# Patient Record
Sex: Male | Born: 1965 | Race: Black or African American | Hispanic: No | Marital: Married | Smoking: Current every day smoker
Health system: Southern US, Community
[De-identification: ages and names within clinical notes are randomized; demographics above are authoritative.]

---

## 1998-10-09 ENCOUNTER — Emergency Department (HOSPITAL_COMMUNITY): Admission: EM | Admit: 1998-10-09 | Discharge: 1998-10-09 | Payer: Self-pay | Admitting: Emergency Medicine

## 2013-08-24 ENCOUNTER — Emergency Department (HOSPITAL_COMMUNITY)
Admission: EM | Admit: 2013-08-24 | Discharge: 2013-08-24 | Disposition: A | Discharge status: Home / Self Care | Payer: Managed Care, Other (non HMO) | Source: Home / Self Care | Attending: Family Medicine | Admitting: Family Medicine

## 2013-08-24 ENCOUNTER — Encounter (HOSPITAL_COMMUNITY): Payer: Self-pay | Admitting: Emergency Medicine

## 2013-08-24 DIAGNOSIS — M549 Dorsalgia, unspecified: Secondary | ICD-10-CM

## 2013-08-24 MED ORDER — KETOROLAC TROMETHAMINE 60 MG/2ML IM SOLN
60.0000 mg | Freq: Once | INTRAMUSCULAR | Status: AC
  Administered 2013-08-24: 60 mg via INTRAMUSCULAR

## 2013-08-24 MED ORDER — KETOROLAC TROMETHAMINE 60 MG/2ML IM SOLN
INTRAMUSCULAR | Status: AC
  Filled 2013-08-24: qty 2

## 2013-08-24 MED ORDER — PREDNISONE 10 MG PO KIT: PACK | ORAL | Status: DC

## 2013-08-24 MED ORDER — ETODOLAC 500 MG PO TABS: 500.0000 mg | ORAL_TABLET | Freq: Two times a day (BID) | ORAL | Status: DC | PRN

## 2013-08-24 NOTE — ED Provider Notes (Signed)
CSN: [id number]     Arrival date & time 08/24/13  1116 History   First MD Initiated Contact with Patient 08/24/13 1318     Chief Complaint  Patient presents with  . Back Pain   (Consider location/radiation/quality/duration/timing/severity/associated sxs/prior Treatment) HPI Comments: 47 year old male presents complaining of lower back pain. This is been going on for approximately 2 weeks. He was transferring his mother from her bed to her chair when apparently she gave up, collapsed, and he had to hold up her entire weight. He did not immediately have any pain, but the next morning his back was very sore. Since that time, he feels like he has had gradually increasing lower back pain. The pain is confined to his lower back only. He has no radiation of the pain down his legs. He has no numbness or weakness in his legs. No loss of bowel or bladder control. No genital or perineal numbness. No previous history of back injury   History reviewed. No pertinent past medical history. No past surgical history on file. No family history on file. History  Substance Use Topics  . Smoking status: Not on file  . Smokeless tobacco: Not on file  . Alcohol Use: Not on file    Review of Systems  Constitutional: Negative for fever, chills and fatigue.  HENT: Negative for sore throat.   Eyes: Negative for visual disturbance.  Respiratory: Negative for cough and shortness of breath.   Cardiovascular: Negative for chest pain, palpitations and leg swelling.  Gastrointestinal: Negative for nausea, vomiting, abdominal pain, diarrhea and constipation.  Genitourinary: Negative for dysuria, urgency, frequency and hematuria.  Musculoskeletal: Positive for back pain. Negative for arthralgias, myalgias, neck pain and neck stiffness.  Skin: Negative for rash.  Neurological: Negative for dizziness, weakness and light-headedness.    Allergies  Review of patient's allergies indicates no known allergies.  Home  Medications   Current Outpatient Rx  Name  Route  Sig  Dispense  Refill  . etodolac (LODINE) 500 MG tablet   Oral   Take 1 tablet (500 mg total) by mouth 2 (two) times daily as needed.   30 tablet   0   . PredniSONE 10 MG KIT      12 day taper dose pack. Use as directed   48 each   0    BP 124/85  Pulse 68  Temp(Src) 97.9 F (36.6 C) (Oral)  Resp 16  SpO2 100% Physical Exam  Nursing note and vitals reviewed. Constitutional: He is oriented to person, place, and time. He appears well-developed and well-nourished. No distress.  HENT:  Head: Normocephalic.  Pulmonary/Chest: Effort normal. No respiratory distress.  Abdominal: Soft. There is no tenderness.  Musculoskeletal:       Lumbar back: He exhibits decreased range of motion, tenderness (over SI joints and lumbar paraspinous musculature) and pain. He exhibits no bony tenderness, no swelling, no edema, no deformity and no spasm.  Negative straight leg raise or crossed straight leg raise  Neurological: He is alert and oriented to person, place, and time. He has normal strength and normal reflexes. No cranial nerve deficit or sensory deficit. He displays a negative Romberg sign. Coordination and gait normal. GCS eye subscore is 4. GCS verbal subscore is 5. GCS motor subscore is 6.  Skin: Skin is warm and dry. No rash noted. He is not diaphoretic.  Psychiatric: He has a normal mood and affect. Judgment normal.    ED Course  Procedures (including critical care time) Labs  Review Labs Reviewed - No data to display Imaging Review No results found.    MDM   1. Back pain    Musculoskeletal back pain, no red flags. We will give him Toradol here and discharge with oral corticosteroids and oral NSAID. He will follow up with his regular doctor as needed.     Meds ordered this encounter  Medications  . ketorolac (TORADOL) injection 60 mg    Sig:   . etodolac (LODINE) 500 MG tablet    Sig: Take 1 tablet (500 mg total) by  mouth 2 (two) times daily as needed.    Dispense:  30 tablet    Refill:  0    Order Specific Question:  Supervising Provider    Answer:  Clementeen Graham, S [id number]  . PredniSONE 10 MG KIT    Sig: 12 day taper dose pack. Use as directed    Dispense:  48 each    Refill:  0    Order Specific Question:  Supervising Provider    Answer:  Clementeen Graham, Kathie Rhodes [[id number]]     Graylon Good, PA-C 08/25/13 1800

## 2013-08-24 NOTE — ED Notes (Signed)
C/o back pain which started two weeks ago.   Patient does help take care of mom as he has to lift her up to move her from one chair to another.  Bayer was used for the pain

## 2013-08-26 NOTE — ED Provider Notes (Signed)
Medical screening examination/treatment/procedure(s) were performed by a resident physician or non-physician practitioner and as the supervising physician I was immediately available for consultation/collaboration.  Kyria Bumgardner, MD    Jacey Pelc S Hollis Oh, MD 08/26/13 0757 

## 2014-04-10 ENCOUNTER — Encounter (HOSPITAL_COMMUNITY): Payer: Self-pay | Admitting: Emergency Medicine

## 2014-04-10 ENCOUNTER — Emergency Department (HOSPITAL_COMMUNITY)
Admission: EM | Admit: 2014-04-10 | Discharge: 2014-04-10 | Disposition: A | Discharge status: Home / Self Care | Payer: BC Managed Care – PPO | Attending: Emergency Medicine | Admitting: Emergency Medicine

## 2014-04-10 DIAGNOSIS — W5501XA Bitten by cat, initial encounter: Secondary | ICD-10-CM

## 2014-04-10 DIAGNOSIS — F17200 Nicotine dependence, unspecified, uncomplicated: Secondary | ICD-10-CM | POA: Insufficient documentation

## 2014-04-10 DIAGNOSIS — Y9389 Activity, other specified: Secondary | ICD-10-CM | POA: Insufficient documentation

## 2014-04-10 DIAGNOSIS — Z791 Long term (current) use of non-steroidal anti-inflammatories (NSAID): Secondary | ICD-10-CM | POA: Insufficient documentation

## 2014-04-10 DIAGNOSIS — S60929A Unspecified superficial injury of unspecified hand, initial encounter: Secondary | ICD-10-CM | POA: Insufficient documentation

## 2014-04-10 DIAGNOSIS — Y9289 Other specified places as the place of occurrence of the external cause: Secondary | ICD-10-CM | POA: Insufficient documentation

## 2014-04-10 DIAGNOSIS — IMO0001 Reserved for inherently not codable concepts without codable children: Secondary | ICD-10-CM | POA: Insufficient documentation

## 2014-04-10 DIAGNOSIS — S61459A Open bite of unspecified hand, initial encounter: Secondary | ICD-10-CM

## 2014-04-10 MED ORDER — AMOXICILLIN-POT CLAVULANATE 875-125 MG PO TABS: 1.0000 | ORAL_TABLET | Freq: Two times a day (BID) | ORAL | Status: DC

## 2014-04-10 NOTE — Discharge Instructions (Signed)
Take Augmentin as directed until gone. Refer to attached documents for more information.

## 2014-04-10 NOTE — ED Notes (Signed)
He was holding 2 kittens this morning and they bit and scratched his hands. He states they kittens have no owner and live outside.

## 2014-04-10 NOTE — ED Provider Notes (Signed)
CSN: [id number]     Arrival date & time 04/10/14  1029 History  This chart was scribed for non-physician practitioner Alvina Chou, PA-C working with [place], DO by Ludger Nutting, ED Scribe. This patient was seen in room TR07C/TR07C and the patient's care was started at 10:54 AM.     Chief Complaint  Patient presents with  . Animal Bite      The history is provided by the patient. No language interpreter was used.   HPI Comments: Derrick Rowe is a 48 y.o. male who presents to the Emergency Department complaining of an animal bite to the bilateral hands that occurred today. Patient states he was holding 2 wild kittens this morning when he was bit and scratched. Patient states they are stray and about 2-3 months old. He states the kittens remain outside but have not been aggressive or exhibited wild behavior. He denies any other symptoms.   History reviewed. No pertinent past medical history. History reviewed. No pertinent past surgical history. History reviewed. No pertinent family history. History  Substance Use Topics  . Smoking status: Current Every Day Smoker    Types: Cigarettes  . Smokeless tobacco: Not on file  . Alcohol Use: Yes    Review of Systems  Constitutional: Negative for fever and chills.  Skin: Positive for wound (animal bite and scratches).  All other systems reviewed and are negative.     Allergies  Review of patient's allergies indicates no known allergies.  Home Medications   Prior to Admission medications   Medication Sig Start Date End Date Taking? Authorizing Provider  etodolac (LODINE) 500 MG tablet Take 1 tablet (500 mg total) by mouth 2 (two) times daily as needed. 08/24/13   Freeman Caldron Baker, PA-C  PredniSONE 10 MG KIT 12 day taper dose pack. Use as directed 08/24/13   Liam Graham, PA-C   BP 107/64  Pulse 98  Temp(Src) 98.6 F (37 C) (Oral)  Resp 16  Ht 6' (1.829 m)  Wt 248 lb (112.492 kg)  BMI 33.63 kg/m2  SpO2 99% Physical  Exam  Nursing note and vitals reviewed. Constitutional: He is oriented to person, place, and time. He appears well-developed and well-nourished.  HENT:  Head: Normocephalic and atraumatic.  Cardiovascular: Normal rate.   Pulmonary/Chest: Effort normal.  Abdominal: He exhibits no distension.  Neurological: He is alert and oriented to person, place, and time.  Skin: Skin is warm and dry.  Psychiatric: He has a normal mood and affect.    ED Course  Procedures (including critical care time)  DIAGNOSTIC STUDIES: Oxygen Saturation is 99% on RA, normal by my interpretation.    COORDINATION OF CARE: 10:57 AM Will prescribe antibiotics.  Discussed treatment plan with pt at bedside and pt agreed to plan.   Labs Review Labs Reviewed - No data to display  Imaging Review No results found.   EKG Interpretation None      MDM   Final diagnoses:  Cat bite of hand    Patient has small puncture wounds of bilateral hands. Patient will be treated with Augmentin. Patient is familiar with the cats and states he has not observed signs of rabies. Patient advised to keep wounds clean and return to the ED with worsening or concerning symptoms.   I personally performed the services described in this documentation, which was scribed in my presence. The recorded information has been reviewed and is accurate.   Alvina Chou, PA-C 04/10/14 1637

## 2014-04-13 NOTE — ED Provider Notes (Signed)
Medical screening examination/treatment/procedure(s) were performed by non-physician practitioner and as supervising physician I was immediately available for consultation/collaboration.   EKG Interpretation None        Delice Bison Mccabe Gloria, DO 04/13/14 1512

## 2017-03-30 ENCOUNTER — Encounter (HOSPITAL_COMMUNITY): Payer: Self-pay | Admitting: Family Medicine

## 2017-03-30 ENCOUNTER — Ambulatory Visit (HOSPITAL_COMMUNITY)
Admission: EM | Admit: 2017-03-30 | Discharge: 2017-03-30 | Disposition: A | Discharge status: Home / Self Care | Payer: BLUE CROSS/BLUE SHIELD | Attending: Internal Medicine | Admitting: Internal Medicine

## 2017-03-30 DIAGNOSIS — T148XXA Other injury of unspecified body region, initial encounter: Secondary | ICD-10-CM

## 2017-03-30 DIAGNOSIS — R1013 Epigastric pain: Secondary | ICD-10-CM | POA: Diagnosis not present

## 2017-03-30 DIAGNOSIS — M545 Low back pain, unspecified: Secondary | ICD-10-CM

## 2017-03-30 MED ORDER — NAPROXEN 375 MG PO TABS: 375.0000 mg | ORAL_TABLET | Freq: Two times a day (BID) | ORAL | 0 refills | Status: AC

## 2017-03-30 MED ORDER — METHOCARBAMOL 500 MG PO TABS: 500.0000 mg | ORAL_TABLET | Freq: Two times a day (BID) | ORAL | 0 refills | Status: DC

## 2017-03-30 MED ORDER — TRAMADOL HCL 50 MG PO TABS: 50.0000 mg | ORAL_TABLET | Freq: Four times a day (QID) | ORAL | 0 refills | Status: DC | PRN

## 2017-03-30 NOTE — ED Triage Notes (Signed)
Pt here for lower back pain that has been there for 3 weeks. sts pretty constant and radiating into buttocks. Denies any weakness, numbness or tingling. Denies injury.

## 2017-03-30 NOTE — ED Provider Notes (Signed)
CSN: [id number]     Arrival date & time 03/30/17  1217 History   First MD Initiated Contact with Patient 03/30/17 1452     Chief Complaint  Patient presents with  . 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 Denies any known injury and does not know why his back hurts. He dries for several hours during the day. He also has to bend over and crank parts of the trailer as well as some lifting. He denies any known event. States he developed the low back pain after driving all day one Saturday. Pain is located across the bilateral para sacral musculature. No radiation. Worse when bending, standing up from sitting position, his the act of sitting and other movements.  Is also complaining of heartburn that started this morning after he ate a plate of sausage and biscuits.      History reviewed. No pertinent past medical history. History reviewed. No pertinent surgical history. History reviewed. No pertinent family history. Social History  Substance Use Topics  . Smoking status: Current Every Day Smoker    Types: Cigarettes  . Smokeless tobacco: Never Used  . Alcohol use Yes    Review of Systems  Constitutional: Negative.   Respiratory: Negative.   Gastrointestinal: Negative.        Heartburn  Genitourinary: Negative.   Musculoskeletal: Positive for back pain and myalgias. Negative for joint swelling and neck pain.       As per HPI  Skin: Negative.   Neurological: Negative for dizziness, weakness, numbness and headaches.  All other systems reviewed and are negative.   Allergies  Patient has no known allergies.  Home Medications   Prior to Admission medications   Medication Sig Start Date End Date Taking? Authorizing Provider  methocarbamol (ROBAXIN) 500 MG tablet Take 1 tablet (500 mg total) by mouth 2  (two) times daily. 03/30/17   Janne Napoleon, NP  Multiple Vitamin (MULTIVITAMIN WITH MINERALS) TABS tablet Take 1 tablet by mouth daily.    [provider]  naproxen (NAPROSYN) 375 MG tablet Take 1 tablet (375 mg total) by mouth 2 (two) times daily. 03/30/17   Janne Napoleon, NP  traMADol (ULTRAM) 50 MG tablet Take 1 tablet (50 mg total) by mouth every 6 (six) hours as needed. 03/30/17   Janne Napoleon, NP   Meds Ordered and Administered this Visit  Medications - No data to display  BP 123/76   Pulse 71   Temp 98.6 F (37 C)   Resp 18   SpO2 100%  No data found.   Physical Exam  Constitutional: He is oriented to person, place, and time. He appears well-developed and well-nourished.  HENT:  Head: Normocephalic and atraumatic.  Eyes: EOM are normal. Left eye exhibits no discharge.  Neck: Normal range of motion. Neck supple.  Cardiovascular: Normal rate.   Pulmonary/Chest: Effort normal.  Musculoskeletal: He exhibits tenderness. He exhibits no edema or deformity.  Tenderness across the posterior sacral musculature. Tenderness over the sacrum. No spinal tenderness, deformity or swelling. No overlying skin discoloration. Able to bend forward approximately 20 before the pain is too much. Lower extremity strength is 5 over 5. Amber toward without assistance.  Neurological: He is alert and oriented to person, place, and time. No cranial nerve deficit.  Skin: Skin is warm and dry.  Psychiatric: He has a normal mood and  affect.  Nursing note and vitals reviewed.   Urgent Care Course     Procedures (including critical care time)  Labs Review Labs Reviewed - No data to display  Imaging Review No results found.   Visual Acuity Review  Right Eye Distance:   Left Eye Distance:   Bilateral Distance:    Right Eye Near:   Left Eye Near:    Bilateral Near:         MDM   1. Acute bilateral low back pain without sciatica   2. Muscle strain   3. Dyspepsia    Read instructions  regarding heartburn. For medications to help may take Zantac twice a day for short period of time. Heat and stretches as demonstrated. No pulling, lifting or bending to reinjure the back. Meds ordered this encounter  Medications  . naproxen (NAPROSYN) 375 MG tablet    Sig: Take 1 tablet (375 mg total) by mouth 2 (two) times daily.    Dispense:  20 tablet    Refill:  0    Order Specific Question:   Supervising Provider    Answer:   Sherlene Shams [[id number]]  . traMADol (ULTRAM) 50 MG tablet    Sig: Take 1 tablet (50 mg total) by mouth every 6 (six) hours as needed.    Dispense:  15 tablet    Refill:  0    Order Specific Question:   Supervising Provider    Answer:   Sherlene Shams [[id number]]  . methocarbamol (ROBAXIN) 500 MG tablet    Sig: Take 1 tablet (500 mg total) by mouth 2 (two) times daily.    Dispense:  20 tablet    Refill:  0    Order Specific Question:   Supervising Provider    Answer:   Sherlene Shams [[id number]]      Janne Napoleon, NP 03/30/17 1540

## 2017-03-30 NOTE — Discharge Instructions (Signed)
Read instructions regarding heartburn. For medications to help may take Zantac twice a day for short period of time. Heat and stretches as demonstrated. No pulling, lifting or bending to reinjure the back.

## 2018-05-30 ENCOUNTER — Encounter (HOSPITAL_COMMUNITY): Payer: Self-pay | Admitting: Emergency Medicine

## 2018-05-30 ENCOUNTER — Emergency Department (HOSPITAL_COMMUNITY)
Admission: EM | Admit: 2018-05-30 | Discharge: 2018-05-31 | Disposition: A | Discharge status: Home / Self Care | Payer: BLUE CROSS/BLUE SHIELD | Attending: Emergency Medicine | Admitting: Emergency Medicine

## 2018-05-30 DIAGNOSIS — L0291 Cutaneous abscess, unspecified: Secondary | ICD-10-CM

## 2018-05-30 DIAGNOSIS — Z79899 Other long term (current) drug therapy: Secondary | ICD-10-CM | POA: Diagnosis not present

## 2018-05-30 DIAGNOSIS — F17210 Nicotine dependence, cigarettes, uncomplicated: Secondary | ICD-10-CM | POA: Diagnosis not present

## 2018-05-30 DIAGNOSIS — L02415 Cutaneous abscess of right lower limb: Secondary | ICD-10-CM | POA: Diagnosis not present

## 2018-05-30 DIAGNOSIS — R2241 Localized swelling, mass and lump, right lower limb: Secondary | ICD-10-CM | POA: Diagnosis present

## 2018-05-30 NOTE — ED Triage Notes (Signed)
Patient presents with skin abscess at right knee with drainage onset this week , denies fever /ambulatory .

## 2018-05-31 MED ORDER — LIDOCAINE-EPINEPHRINE (PF) 2 %-1:200000 IJ SOLN
10.0000 mL | Freq: Once | INTRAMUSCULAR | Status: AC
  Administered 2018-05-31: 10 mL
  Filled 2018-05-31: qty 20

## 2018-05-31 MED ORDER — OXYCODONE-ACETAMINOPHEN 5-325 MG PO TABS
1.0000 | ORAL_TABLET | Freq: Once | ORAL | Status: AC
  Administered 2018-05-31: 1 via ORAL
  Filled 2018-05-31: qty 1

## 2018-05-31 MED ORDER — HYDROCODONE-ACETAMINOPHEN 5-325 MG PO TABS: 1.0000 | ORAL_TABLET | ORAL | 0 refills | Status: DC | PRN

## 2018-05-31 MED ORDER — IBUPROFEN 600 MG PO TABS: 600.0000 mg | ORAL_TABLET | Freq: Four times a day (QID) | ORAL | 0 refills | Status: AC | PRN

## 2018-05-31 MED ORDER — DOXYCYCLINE HYCLATE 100 MG PO TABS
100.0000 mg | ORAL_TABLET | Freq: Once | ORAL | Status: AC
  Administered 2018-05-31: 100 mg via ORAL
  Filled 2018-05-31: qty 1

## 2018-05-31 MED ORDER — DOXYCYCLINE HYCLATE 100 MG PO CAPS: 100.0000 mg | ORAL_CAPSULE | Freq: Two times a day (BID) | ORAL | 0 refills | Status: DC

## 2018-05-31 NOTE — ED Provider Notes (Signed)
[facility] Provider Note   CSN: [id number] Arrival date & time: 05/30/18  2316     History   Chief Complaint Chief Complaint  Patient presents with  . Abscess    HPI Derrick Rowe is a 52 y.o. male.  Patient presents with sore, swollen area to right knee x 1.5 weeks. He states it drained a little last week but none since. No fever. He denies pain with joint movement. He can bear weight and walk comfortably. No history of abscess.   The history is provided by the patient and the spouse. No language interpreter was used.  Abscess  Associated symptoms: no fever and no nausea     History reviewed. No pertinent past medical history.  There are no active problems to display for this patient.   History reviewed. No pertinent surgical history.      Home Medications    Prior to Admission medications   Medication Sig Start Date End Date Taking? Authorizing Provider  methocarbamol (ROBAXIN) 500 MG tablet Take 1 tablet (500 mg total) by mouth 2 (two) times daily. 03/30/17   Janne Napoleon, NP  Multiple Vitamin (MULTIVITAMIN WITH MINERALS) TABS tablet Take 1 tablet by mouth daily.    [provider]  naproxen (NAPROSYN) 375 MG tablet Take 1 tablet (375 mg total) by mouth 2 (two) times daily. 03/30/17   Janne Napoleon, NP  traMADol (ULTRAM) 50 MG tablet Take 1 tablet (50 mg total) by mouth every 6 (six) hours as needed. 03/30/17   Janne Napoleon, NP    Family History No family history on file.  Social History Social History   Tobacco Use  . Smoking status: Current Every Day Smoker    Types: Cigarettes  . Smokeless tobacco: Never Used  Substance Use Topics  . Alcohol use: Yes  . Drug use: No     Allergies   Patient has no known allergies.   Review of Systems Review of Systems  Constitutional: Negative for fever.  Gastrointestinal: Negative for nausea.  Musculoskeletal:       See HPI.  Skin: Positive for wound.  Neurological:  Negative for weakness and numbness.     Physical Exam Updated Vital Signs BP 127/79 (BP Location: Left Arm)   Pulse 75   Temp 98.2 F (36.8 C) (Oral)   Resp 16   Ht 6' (1.829 m)   Wt 113.4 kg (250 lb)   SpO2 98%   BMI 33.91 kg/m   Physical Exam  Constitutional: He is oriented to person, place, and time. He appears well-developed and well-nourished.  Neck: Normal range of motion.  Pulmonary/Chest: Effort normal.  Musculoskeletal: Normal range of motion.  No right knee joint swelling or effusion. FROM with pain at abscess only. No calf or thigh tenderness. No surrounding redness to suggest cellulitis.   Neurological: He is alert and oriented to person, place, and time.  Skin: Skin is warm and dry.  Raised, tender, fluctuant abscess measuring 3 cm diameter to anterior right knee.   Psychiatric: He has a normal mood and affect.     ED Treatments / Results  Labs (all labs ordered are listed, but only abnormal results are displayed) Labs Reviewed - No data to display  EKG None  Radiology No results found.  Procedures .Marland KitchenIncision and Drainage Date/Time: 05/31/2018 4:47 AM Performed by: Charlann Lange, PA-C Authorized by: Charlann Lange, PA-C   Consent:    Consent obtained:  Verbal   Consent given by:  Patient  Location:    Type:  Abscess   Location:  Lower extremity   Lower extremity location:  Knee   Knee location:  R knee Pre-procedure details:    Skin preparation:  Betadine Anesthesia (see MAR for exact dosages):    Anesthesia method:  Local infiltration   Local anesthetic:  Lidocaine 2% WITH epi Procedure type:    Complexity:  Simple Procedure details:    Needle aspiration: no     Incision types:  Stab incision   Scalpel blade:  11 Post-procedure details:    Patient tolerance of procedure:  Tolerated with difficulty Comments:     Patient was not able to sit still during procedure. Pulls the leg back requiring multiple sticks, after which he continued to  move around while attempting to open by incision. Procedure stopped due to patient creating an unsafe environment to continue I&D. There was a small stab incision made prior to having to stop.    (including critical care time)  Medications Ordered in ED Medications - No data to display   Initial Impression / Assessment and Plan / ED Course  I have reviewed the triage vital signs and the nursing notes.  Pertinent labs & imaging results that were available during my care of the patient were reviewed by me and considered in my medical decision making (see chart for details).     Patient here with cutaneous, uncomplicated abscess to right knee. He is unable to cooperate with I&D procedure, ultimately making the I&D unsuccessful. Will start on antibiotics (doxycycline) and stress 2 day follow up for further management with PCP.   Final Clinical Impressions(s) / ED Diagnoses   Final diagnoses:  None   1. Cutaneous abscess right knee  ED Discharge Orders    None       Charlann Lange, PA-C 05/31/18 0451    Ripley Fraise, MD 05/31/18 [phone]

## 2018-05-31 NOTE — Discharge Instructions (Addendum)
See your doctor in 2 days for recheck of infection. Apply warm compresses and take the antibiotic as prescribed. Return here with any worsening symptoms - fever, internal knee pain, new concern.

## 2018-06-02 ENCOUNTER — Encounter (HOSPITAL_COMMUNITY): Payer: Self-pay | Admitting: Emergency Medicine

## 2018-06-02 ENCOUNTER — Emergency Department (HOSPITAL_COMMUNITY)
Admission: EM | Admit: 2018-06-02 | Discharge: 2018-06-02 | Disposition: A | Discharge status: Home / Self Care | Payer: BLUE CROSS/BLUE SHIELD | Attending: Emergency Medicine | Admitting: Emergency Medicine

## 2018-06-02 DIAGNOSIS — F17210 Nicotine dependence, cigarettes, uncomplicated: Secondary | ICD-10-CM | POA: Diagnosis not present

## 2018-06-02 DIAGNOSIS — Z79899 Other long term (current) drug therapy: Secondary | ICD-10-CM | POA: Diagnosis not present

## 2018-06-02 DIAGNOSIS — L729 Follicular cyst of the skin and subcutaneous tissue, unspecified: Secondary | ICD-10-CM

## 2018-06-02 DIAGNOSIS — R2241 Localized swelling, mass and lump, right lower limb: Secondary | ICD-10-CM | POA: Diagnosis present

## 2018-06-02 DIAGNOSIS — L720 Epidermal cyst: Secondary | ICD-10-CM | POA: Insufficient documentation

## 2018-06-02 LAB — AEROBIC CULTURE  (SUPERFICIAL SPECIMEN): SPECIAL REQUESTS: NORMAL

## 2018-06-02 LAB — AEROBIC CULTURE W GRAM STAIN (SUPERFICIAL SPECIMEN)

## 2018-06-02 MED ORDER — LIDOCAINE-EPINEPHRINE (PF) 2 %-1:200000 IJ SOLN
10.0000 mL | Freq: Once | INTRAMUSCULAR | Status: AC
  Administered 2018-06-02: 10 mL via INTRADERMAL
  Filled 2018-06-02: qty 20

## 2018-06-02 NOTE — ED Provider Notes (Signed)
[facility] EMERGENCY DEPARTMENT Provider Note   CSN: [id number] Arrival date & time: 06/02/18  1105     History   Chief Complaint Chief Complaint  Patient presents with  . Wound Check    HPI Derrick Rowe is a 52 y.o. male.  HPI   Patient is a 52 year old male with no significant past medical history presents emergency department today for evaluation of a wound to his right anterior knee which has been present for the last 2 weeks.  Patient was seen in the ED on 05/30/2018 for evaluation of the wound and had an attempted I&D procedure for which she was unable to tolerate.  He was placed on doxycycline which she has been taking.  He denies any pain to the area but states that it has still been somewhat red.  Denies any drainage from the wound that he is aware of as he has been keeping it covered in putting Neosporin on it.  Denies any fevers.  Denies any other exacerbating or alleviating factors.   History reviewed. No pertinent past medical history.  There are no active problems to display for this patient.   History reviewed. No pertinent surgical history.      Home Medications    Prior to Admission medications   Medication Sig Start Date End Date Taking? Authorizing Provider  doxycycline (VIBRAMYCIN) 100 MG capsule Take 1 capsule (100 mg total) by mouth 2 (two) times daily. 05/31/18   Charlann Lange, PA-C  HYDROcodone-acetaminophen (NORCO/VICODIN) 5-325 MG tablet Take 1 tablet by mouth every 4 (four) hours as needed. 05/31/18   Charlann Lange, PA-C  ibuprofen (ADVIL,MOTRIN) 600 MG tablet Take 1 tablet (600 mg total) by mouth every 6 (six) hours as needed. 05/31/18   Charlann Lange, PA-C  methocarbamol (ROBAXIN) 500 MG tablet Take 1 tablet (500 mg total) by mouth 2 (two) times daily. 03/30/17   Janne Napoleon, NP  Multiple Vitamin (MULTIVITAMIN WITH MINERALS) TABS tablet Take 1 tablet by mouth daily.    [provider]  naproxen (NAPROSYN) 375 MG tablet Take 1  tablet (375 mg total) by mouth 2 (two) times daily. 03/30/17   Janne Napoleon, NP  traMADol (ULTRAM) 50 MG tablet Take 1 tablet (50 mg total) by mouth every 6 (six) hours as needed. 03/30/17   Janne Napoleon, NP    Family History History reviewed. No pertinent family history.  Social History Social History   Tobacco Use  . Smoking status: Current Every Day Smoker    Types: Cigarettes  . Smokeless tobacco: Never Used  Substance Use Topics  . Alcohol use: Yes  . Drug use: No     Allergies   Patient has no known allergies.   Review of Systems Review of Systems  Constitutional: Negative for fever.  Musculoskeletal:       Knee pain  Skin: Positive for wound.  Neurological: Negative for weakness and numbness.     Physical Exam Updated Vital Signs BP 125/80 (BP Location: Right Arm)   Pulse 84   Temp 98 F (36.7 C) (Oral)   Resp 18   SpO2 100%   Physical Exam  Constitutional: He is oriented to person, place, and time. He appears well-developed and well-nourished. No distress.  Eyes: Conjunctivae are normal.  Cardiovascular: Normal rate and regular rhythm.  Pulmonary/Chest: Effort normal and breath sounds normal.  Musculoskeletal:  2.5cm area of erythema and fluctunace to the right anterior knee that is mildly TTP. Central area of open skin that appears  to have purulent fluid that is not actively draining. FROM of the knee. No erythema/warmth to the knee joint. Ambulatory.  Neurological: He is alert and oriented to person, place, and time.  Skin: Skin is warm and dry.     ED Treatments / Results  Labs (all labs ordered are listed, but only abnormal results are displayed) Labs Reviewed - No data to display  EKG None  Radiology No results found.  Procedures .Marland KitchenIncision and Drainage Date/Time: 06/02/2018 1:04 PM Performed by: Rodney Booze, PA-C Authorized by: Rodney Booze, PA-C   Consent:    Consent obtained:  Verbal   Consent given by:  Patient and parent    Risks discussed:  Incomplete drainage, bleeding and pain   Alternatives discussed:  No treatment Location:    Type:  Cyst   Size:  2.5   Location: right knee. Pre-procedure details:    Skin preparation:  Betadine Procedure type:    Complexity:  Simple Procedure details:    Needle aspiration: yes     Incision types:  Single straight   Scalpel blade:  11   Wound management:  Irrigated with saline and probed and deloculated   Drainage:  Purulent   Drainage amount:  Scant   Packing materials:  None Post-procedure details:    Patient tolerance of procedure:  Tolerated well, no immediate complications   (including critical care time)  Medications Ordered in ED Medications  lidocaine-EPINEPHrine (XYLOCAINE W/EPI) 2 %-1:200000 (PF) injection 10 mL (10 mLs Intradermal Given 06/02/18 1241)     Initial Impression / Assessment and Plan / ED Course  I have reviewed the triage vital signs and the nursing notes.  Pertinent labs & imaging results that were available during my care of the patient were reviewed by me and considered in my medical decision making (see chart for details).     Final Clinical Impressions(s) / ED Diagnoses   Final diagnoses:  Cyst of skin   Patient with skin abscess vs cyst to left knee that is amenable to incision and drainage.  Area was not large enough to warrant packing or drain,  wound recheck in 2 days. Encouraged home warm soaks and flushing.  Mild signs of cellulitis is surrounding skin.  Will d/c to home.  Advised him to continue the antibiotic therapy that is currently on.  No evidence of septic arthritis at this time.  Return precautions discussed and patient voiced understanding of plan and reasons to return immediately to the ED.  All questions answered.   ED Discharge Orders    None       Bishop Dublin 06/02/18 1307    Charlesetta Shanks, MD 06/17/18 1537

## 2018-06-02 NOTE — Discharge Instructions (Signed)
Continue the antibiotic that you were given at your last visit.  Follow-up for wound recheck in 48 hours at [facility] urgent care.  Return to the ER for any signs of worsening infection.

## 2018-06-02 NOTE — ED Notes (Signed)
ED Provider at bedside. 

## 2018-06-02 NOTE — ED Triage Notes (Signed)
Patient presents to ED for re-assessment of right knee abscess, which APP attempted to drain without success.  Patient denies any worsening of the wound since his previous visit.

## 2018-06-03 ENCOUNTER — Telehealth: Payer: Self-pay | Admitting: Emergency Medicine

## 2018-06-03 NOTE — Telephone Encounter (Signed)
Post ED Visit - Positive Culture Follow-up  Culture report reviewed by antimicrobial stewardship pharmacist:  []  Elenor Quinones, Pharm.D. []  Heide Guile, Pharm.D., BCPS AQ-ID []  Parks Neptune, Pharm.D., BCPS []  Alycia Rossetti, Pharm.D., BCPS []  [place], Pharm.D., BCPS, AAHIVP []  Legrand Como, Pharm.D., BCPS, AAHIVP []  Salome Arnt, PharmD, BCPS []  Johnnette Gourd, PharmD, BCPS []  Hughes Better, PharmD, BCPS []  Leeroy Cha, PharmD Danella Maiers PharmD  Positive wound culture Treated with none, asymptomatic,no further patient follow-up is required at this time.  Hazle Nordmann 06/03/2018, 9:59 AM

## 2018-06-04 NOTE — ED Notes (Signed)
06/04/2018,  Pt. Called this RN's phone, requested results of his Labs.  Reviewed the results of his visit.   Pt. Verbalized understanding of results.  Pt. To continue with his discharge instructions. No change noted.  Pt. Verbalized understanding and all questions answered. Pt. Verbalized understanding of s/s of an infected wound and wound care and when to return to the ED.

## 2021-09-11 ENCOUNTER — Other Ambulatory Visit: Payer: Self-pay

## 2021-09-11 ENCOUNTER — Emergency Department (HOSPITAL_COMMUNITY)
Admission: EM | Admit: 2021-09-11 | Discharge: 2021-09-11 | Disposition: A | Discharge status: Home / Self Care | Payer: BC Managed Care – PPO | Attending: Emergency Medicine | Admitting: Emergency Medicine

## 2021-09-11 ENCOUNTER — Emergency Department (HOSPITAL_COMMUNITY): Payer: BC Managed Care – PPO

## 2021-09-11 ENCOUNTER — Encounter (HOSPITAL_COMMUNITY): Payer: Self-pay | Admitting: Emergency Medicine

## 2021-09-11 DIAGNOSIS — E236 Other disorders of pituitary gland: Secondary | ICD-10-CM

## 2021-09-11 DIAGNOSIS — R519 Headache, unspecified: Secondary | ICD-10-CM | POA: Diagnosis present

## 2021-09-11 DIAGNOSIS — H9319 Tinnitus, unspecified ear: Secondary | ICD-10-CM | POA: Insufficient documentation

## 2021-09-11 DIAGNOSIS — R42 Dizziness and giddiness: Secondary | ICD-10-CM | POA: Diagnosis not present

## 2021-09-11 DIAGNOSIS — F17210 Nicotine dependence, cigarettes, uncomplicated: Secondary | ICD-10-CM | POA: Diagnosis not present

## 2021-09-11 DIAGNOSIS — Z20822 Contact with and (suspected) exposure to covid-19: Secondary | ICD-10-CM | POA: Insufficient documentation

## 2021-09-11 LAB — CBC WITH DIFFERENTIAL/PLATELET
Abs Immature Granulocytes: 0.01 10*3/uL (ref 0.00–0.07)
Basophils Absolute: 0 10*3/uL (ref 0.0–0.1)
Basophils Relative: 1 %
Eosinophils Absolute: 0.1 10*3/uL (ref 0.0–0.5)
Eosinophils Relative: 1 %
HCT: 43.9 % (ref 39.0–52.0)
Hemoglobin: 14.9 g/dL (ref 13.0–17.0)
Immature Granulocytes: 0 %
Lymphocytes Relative: 29 %
Lymphs Abs: 2.4 10*3/uL (ref 0.7–4.0)
MCH: 30 pg (ref 26.0–34.0)
MCHC: 33.9 g/dL (ref 30.0–36.0)
MCV: 88.5 fL (ref 80.0–100.0)
Monocytes Absolute: 0.6 10*3/uL (ref 0.1–1.0)
Monocytes Relative: 7 %
Neutro Abs: 5.3 10*3/uL (ref 1.7–7.7)
Neutrophils Relative %: 62 %
Platelets: 205 10*3/uL (ref 150–400)
RBC: 4.96 MIL/uL (ref 4.22–5.81)
RDW: 12.4 % (ref 11.5–15.5)
WBC: 8.4 10*3/uL (ref 4.0–10.5)
nRBC: 0 % (ref 0.0–0.2)

## 2021-09-11 LAB — COMPREHENSIVE METABOLIC PANEL
ALT: 21 U/L (ref 0–44)
AST: 29 U/L (ref 15–41)
Albumin: 4 g/dL (ref 3.5–5.0)
Alkaline Phosphatase: 87 U/L (ref 38–126)
Anion gap: 10 (ref 5–15)
BUN: 10 mg/dL (ref 6–20)
CO2: 24 mmol/L (ref 22–32)
Calcium: 9.4 mg/dL (ref 8.9–10.3)
Chloride: 102 mmol/L (ref 98–111)
Creatinine, Ser: 1.28 mg/dL — ABNORMAL HIGH (ref 0.61–1.24)
GFR, Estimated: >60 mL/min (ref >60)
Glucose, Bld: 93 mg/dL (ref 70–99)
Potassium: 3.9 mmol/L (ref 3.5–5.1)
Sodium: 136 mmol/L (ref 135–145)
Total Bilirubin: 1.3 mg/dL — ABNORMAL HIGH (ref 0.3–1.2)
Total Protein: 7.4 g/dL (ref 6.5–8.1)

## 2021-09-11 LAB — CORTISOL: Cortisol, Plasma: 6 ug/dL

## 2021-09-11 LAB — RESP PANEL BY RT-PCR (FLU A&B, COVID) ARPGX2
Influenza A by PCR: NEGATIVE
Influenza B by PCR: NEGATIVE
SARS Coronavirus 2 by RT PCR: NEGATIVE

## 2021-09-11 LAB — TSH: TSH: 0.598 u[IU]/mL (ref 0.350–4.500)

## 2021-09-11 LAB — T4, FREE: Free T4: 1.26 ng/dL — ABNORMAL HIGH (ref 0.61–1.12)

## 2021-09-11 IMAGING — MR MR HEAD WO/W CM
7 of 13 series · 26 of 48 positions shown · IV contrast (Yes GAD)
Comparison: Same day CT head.

CLINICAL DATA: Brain mass or lesion new pituitary tumor the

EXAM:
MRI HEAD WITHOUT AND WITH CONTRAST
TECHNIQUE: Multiplanar, multiecho pulse sequences of the brain and surrounding
structures were obtained without and with intravenous contrast.
CONTRAST:  10mL GADAVIST GADOBUTROL 1 MMOL/ML IV SOLN

[Series 2: DWI · axial · 3.0mm · 0.94mm/px · z∈[-110,+39]mm · 7 of 104 slices shown (1 of 2)]
[im 1/104]
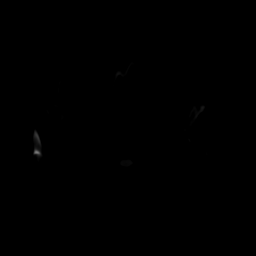
[im 18/104]
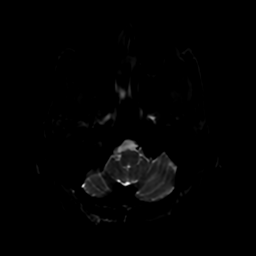
[im 35/104]
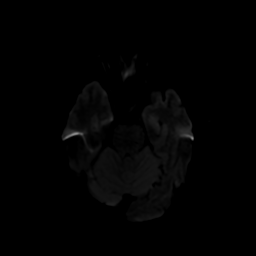
[im 52/104]
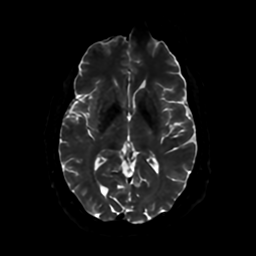
[im 69/104]
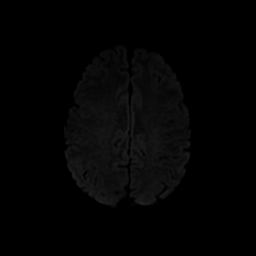
[im 86/104]
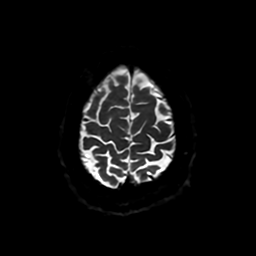
[im 104/104]
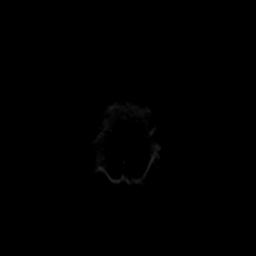

[Series 3: DWI · coronal · 4.0mm · 0.94mm/px · 5 of 80 slices shown (2 of 2)]
[im 1/80]
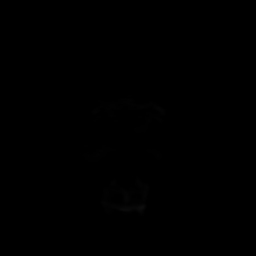
[im 20/80]
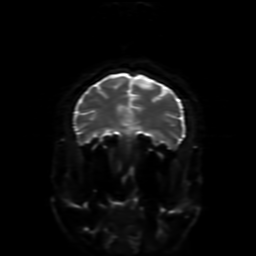
[im 40/80]
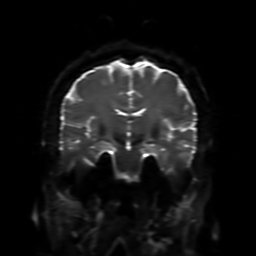
[im 60/80]
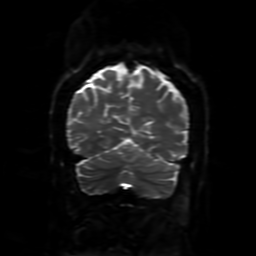
[im 80/80]
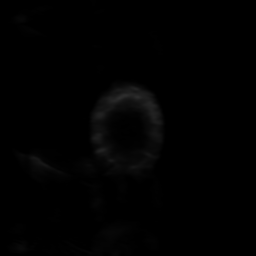

[Series 4: FLAIR · sagittal · 5.0mm · 0.23mm/px · 2 of 23 slices shown (1 of 2)]
[im 1/23]
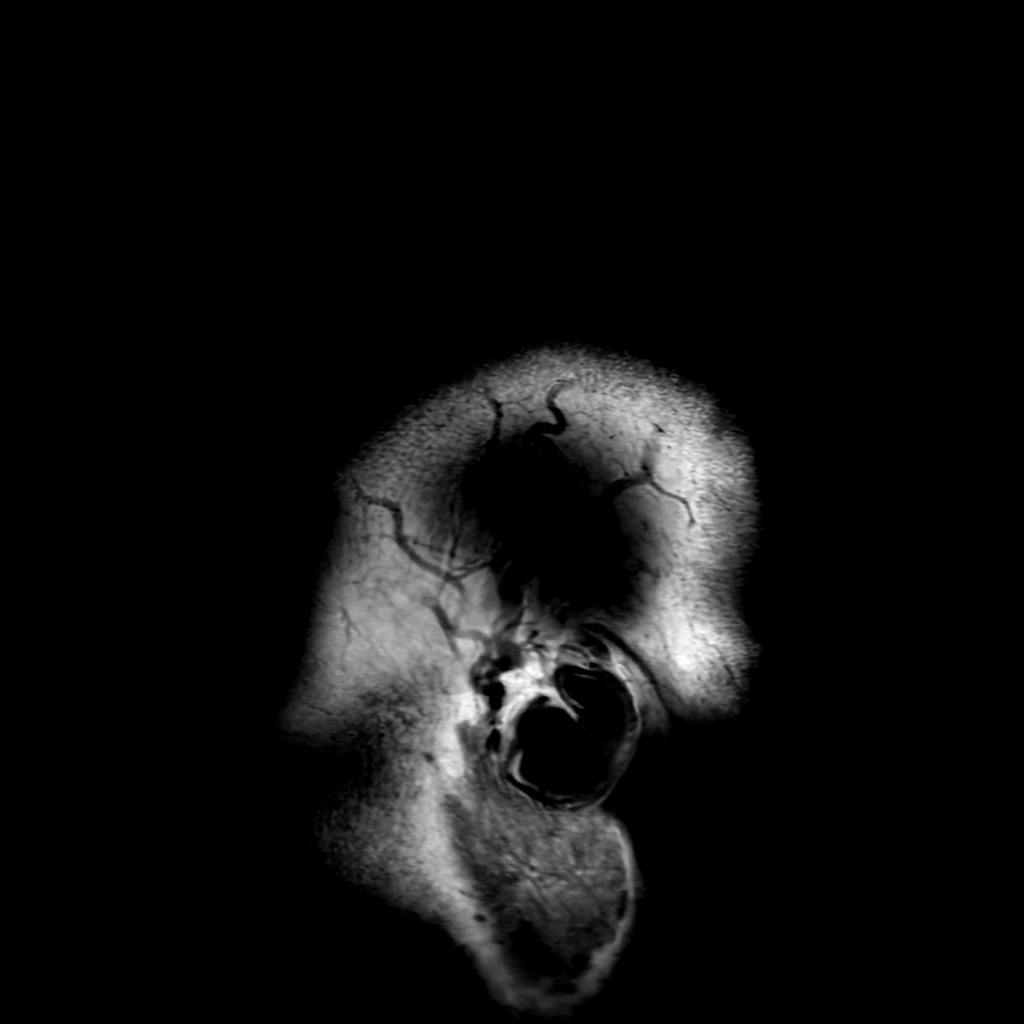
[im 23/23]
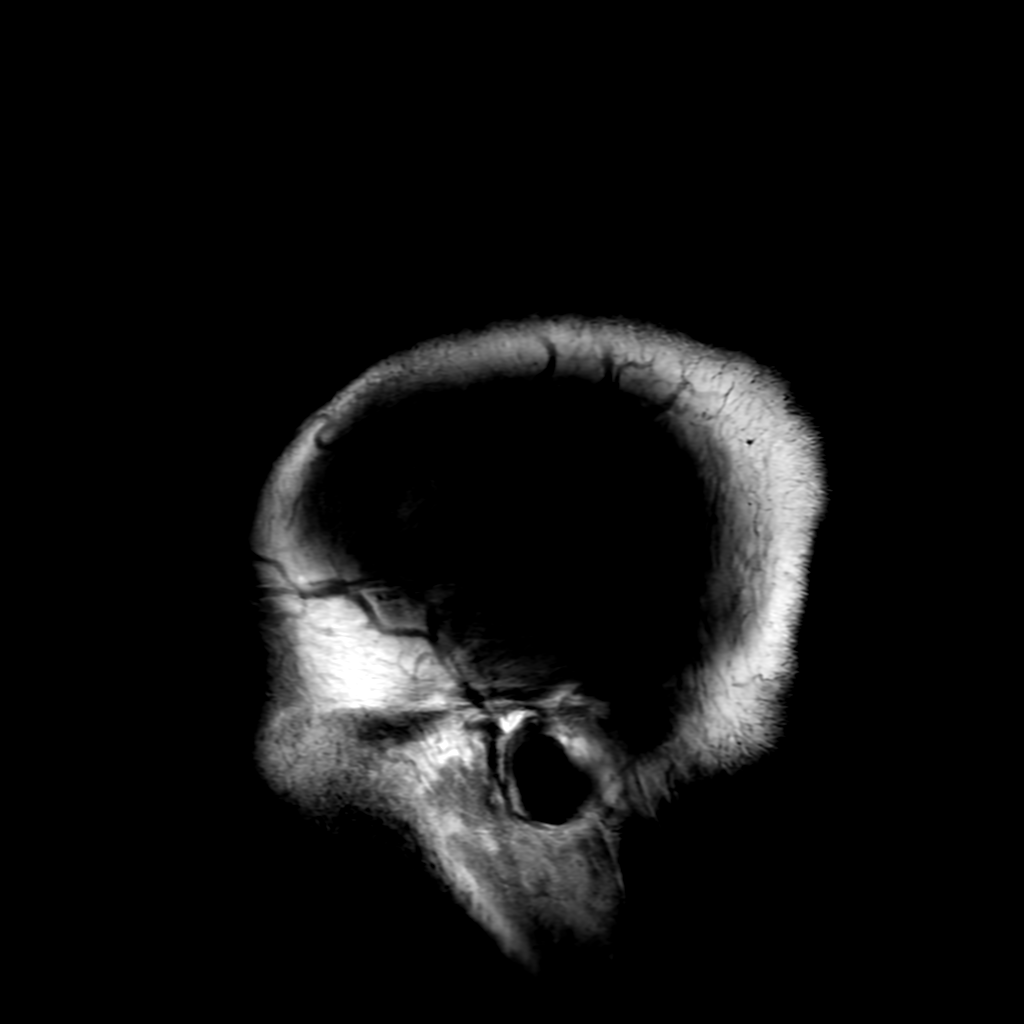

[Series 6: FLAIR · axial · 4.0mm · 0.45mm/px · z∈[-107,+39]mm · 3 of 35 slices shown (2 of 2)]
[im 1/35]
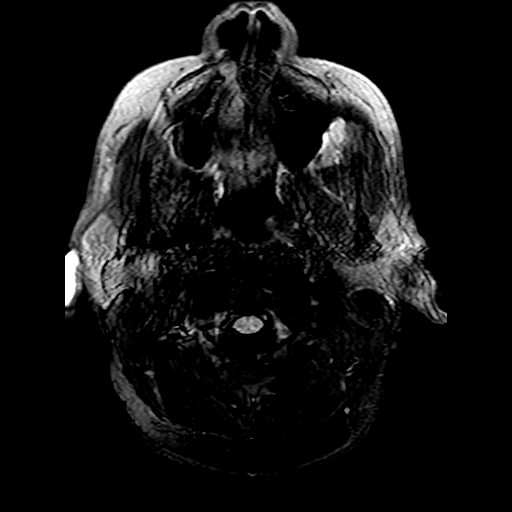
[im 18/35]
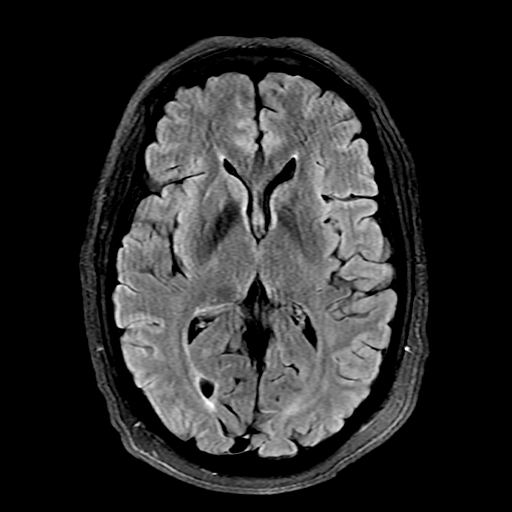
[im 35/35]
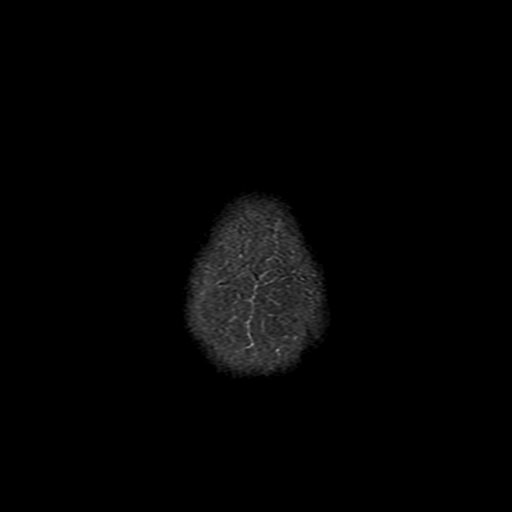

[Series 12: FLAIR post-contrast · sagittal · 5.0mm · 0.47mm/px · 2 of 23 slices shown]
[im 1/23]
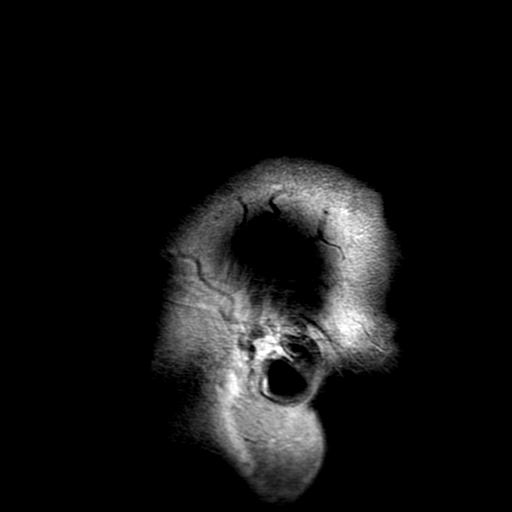
[im 23/23]
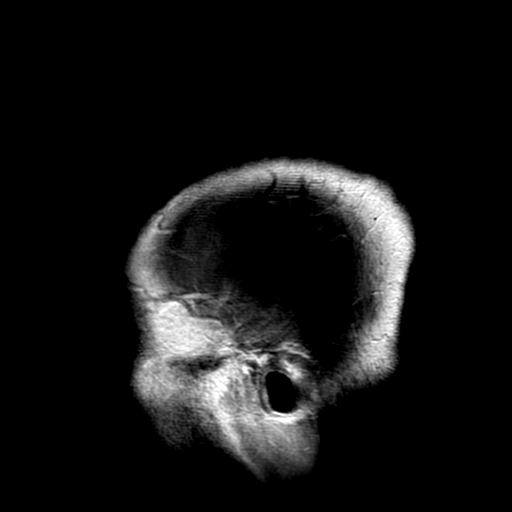

[Series 250: ADC · axial · 3.0mm · 0.94mm/px · z∈[-110,+39]mm · 4 of 52 slices shown (1 of 2)]
[im 1/52]
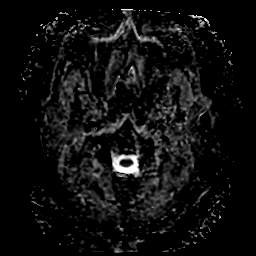
[im 18/52]
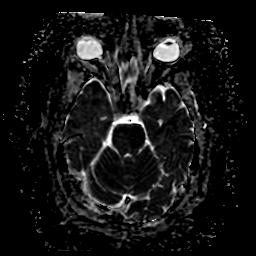
[im 35/52]
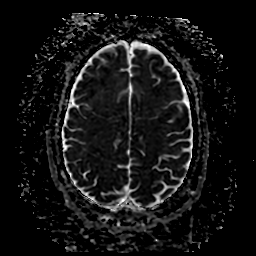
[im 52/52]
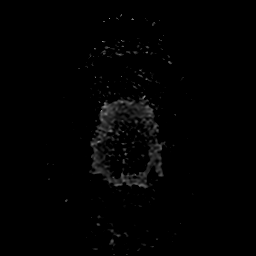

[Series 350: ADC · coronal · 4.0mm · 0.94mm/px · 3 of 40 slices shown (2 of 2)]
[im 1/40]
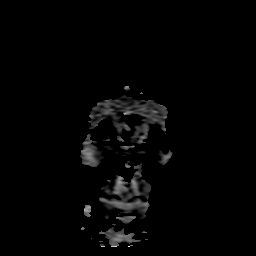
[im 20/40]
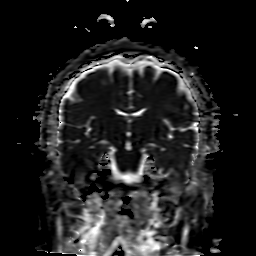
[im 40/40]
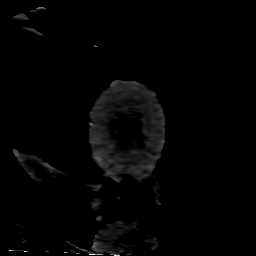

[26 of 48 positions shown; findings below may reference images not displayed]

FINDINGS: Brain: Confirmed pituitary mass, ocated within the right eccentric
sella and measuring approximately 1.5 x 1.0 x 1.0 cm. Suprasellar
extension the mass, which does not appear to contact the optic
chiasm. The mass is probably arising from the pituitary gland and
displaces the infundibulum to the left. Possible right cavernous
sinus invasion. The mass demonstrates heterogeneous intrinsic T1
hyperintensity and heterogeneous enhancement. Pituitary protocol was
not performed. Outside of the pituitary, no evidence of acute
hemorrhage, mass lesion, or abnormal enhancement. No acute infarct.
No hydrocephalus. No midline shift or extra-axial fluid collection.
There are a few mild scattered T2 hyperintensities in the white
matter, which are nonspecific but compatible with chronic
microvascular ischemic disease.

Vascular: Major arterial flow voids are maintained at the skull
base.

Skull and upper cervical spine: Normal marrow signal.

Sinuses/Orbits: Mild paranasal sinus mucosal thickening.
Unremarkable orbits.

Other: No mastoid effusions
IMPRESSION: 1. Confirmed pituitary mass, located within the right eccentric
sella and measuring approximately 1.5 x 1.0 x 1.0 cm. Favor
pituitary macroadenoma (potentially hemorrhagic given intrinsic T1
hyperintensity) over a complex, large Rathke's cleft cyst or other
pituitary mass. Given the patient's reported headaches and possible
hemorrhage within the mass, pituitary apoplexy is a consideration.
Recommend correlation with pituitary/endocri function labs.
2. Possible right cavernous sinus invasion. A pituitary protocol MRI
could further evaluate the mass and better assess the relationship
with the cavernous sinus.

## 2021-09-11 IMAGING — CT CT HEAD W/O CM
4 series · 17 of 47 positions shown, 19 images · non-contrast
Comparison: No pertinent prior exams available for comparison.

CLINICAL DATA: Headache, new or worsening. Additional history
provided: Patient reports headache, neck pain, ringing in bilateral
ears for 4 days.

EXAM:
CT HEAD WITHOUT CONTRAST
TECHNIQUE: Contiguous axial images were obtained from the base of the skull
through the vertex without intravenous contrast.

[Series 3: head wo · axial · 0.41mm/px · z∈[-136,-16]mm · 7 of 33 slices shown, 9 images]
[im 5/33  brain]
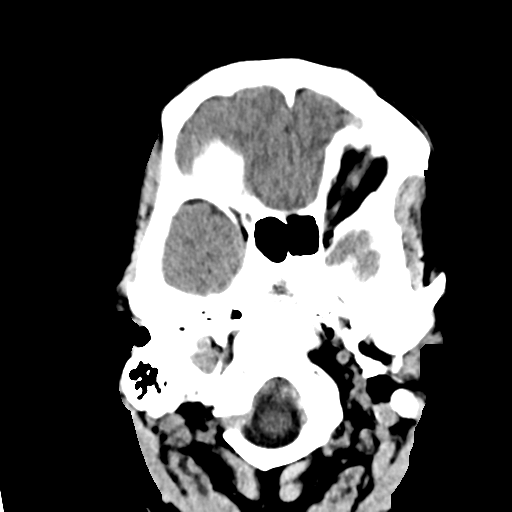
[im 5/33  bone]
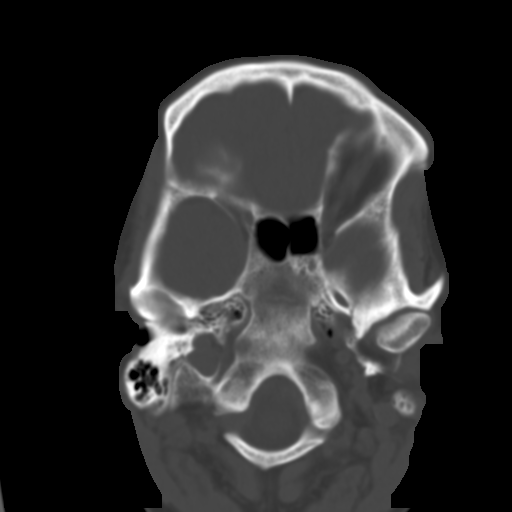
[im 9/33  brain]
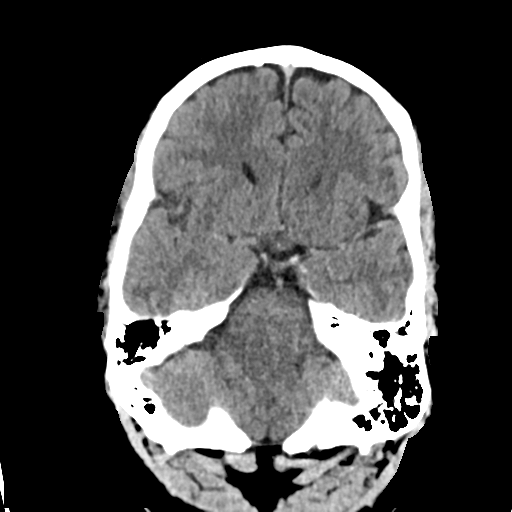
[im 13/33  brain]
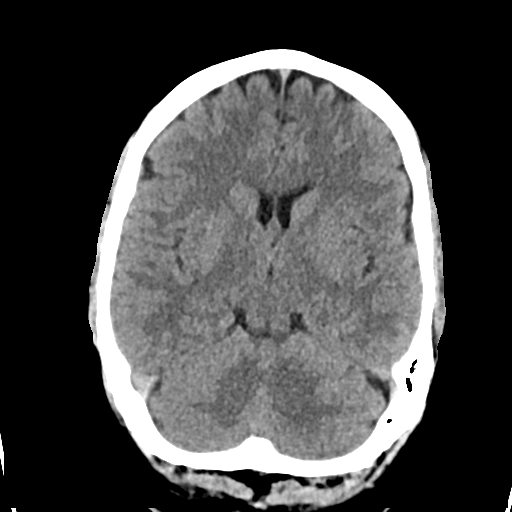
[im 17/33  brain]
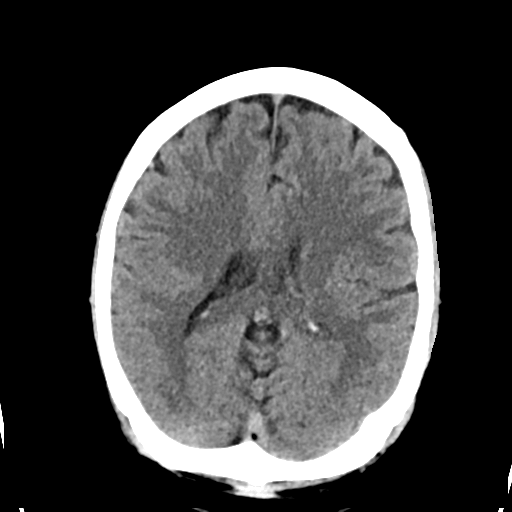
[im 21/33  brain]
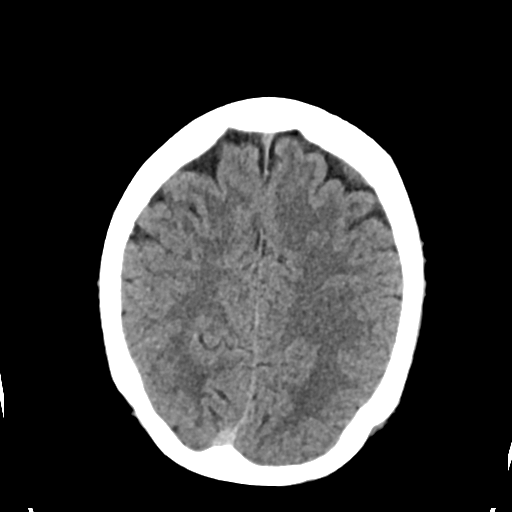
[im 21/33  bone]
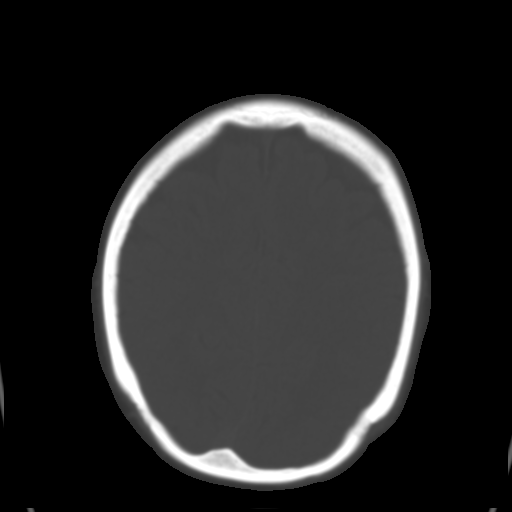
[im 25/33  brain]
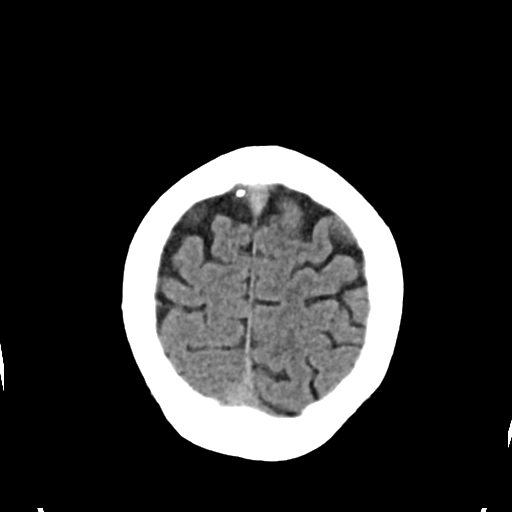
[im 29/33  brain]
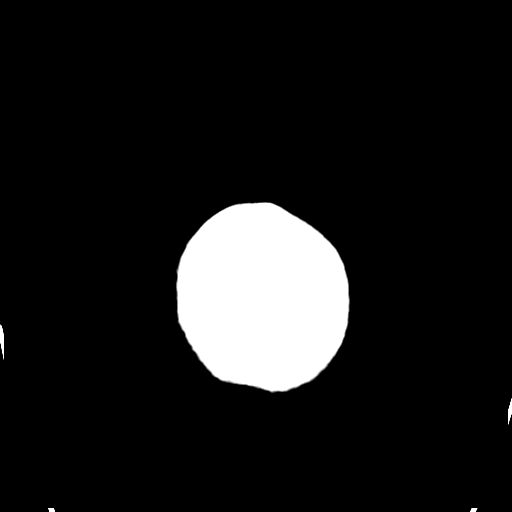

[Series 4: head bone · axial · 0.41mm/px · z∈[-140,-84]mm · 4 of 83 slices shown]
[im 9/83  bone]
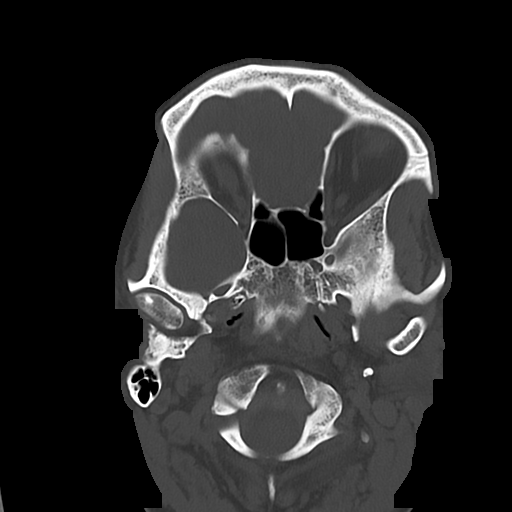
[im 17/83  bone]
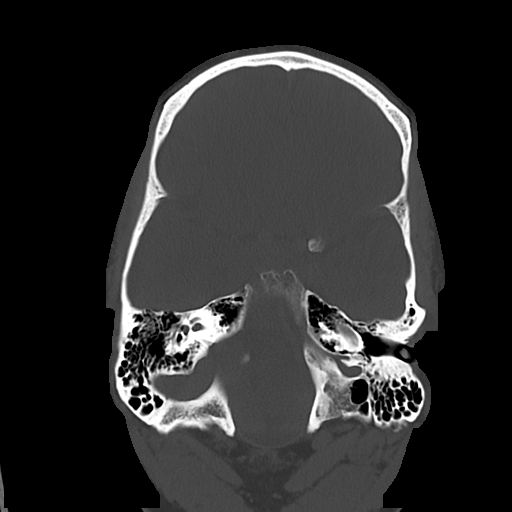
[im 25/83  bone]
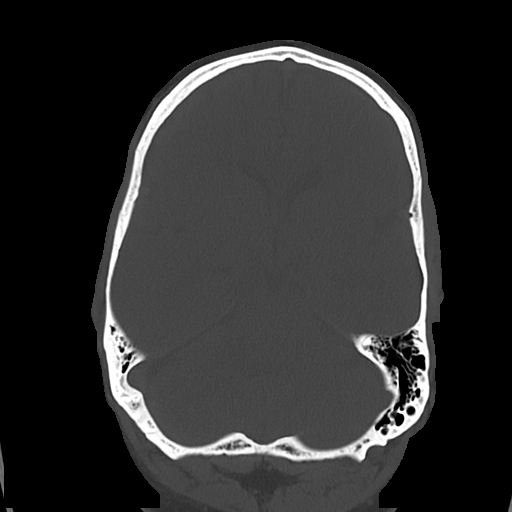
[im 37/83  bone]
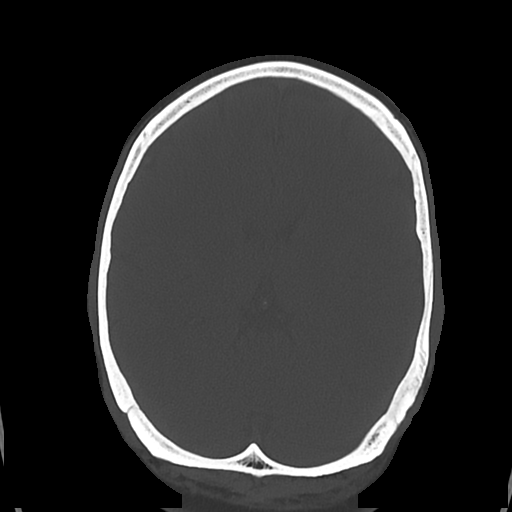

[Series 5: sag soft · sagittal · 0.37mm/px · 3 of 59 slices shown]
[im 20/59  brain]
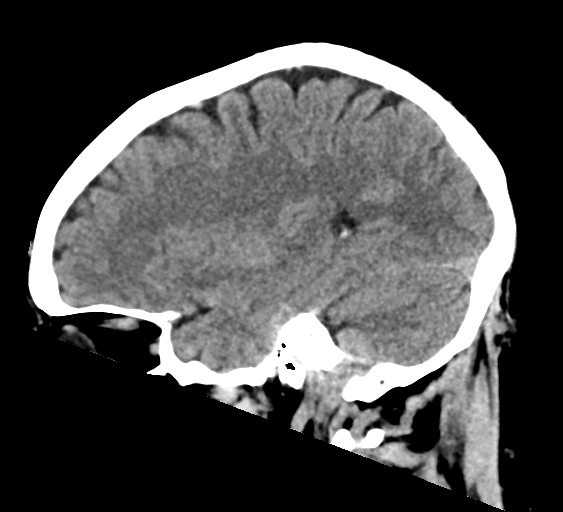
[im 30/59  brain]
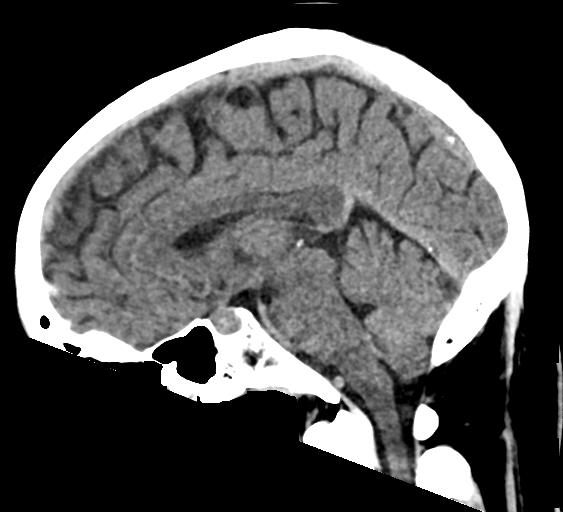
[im 39/59  brain]
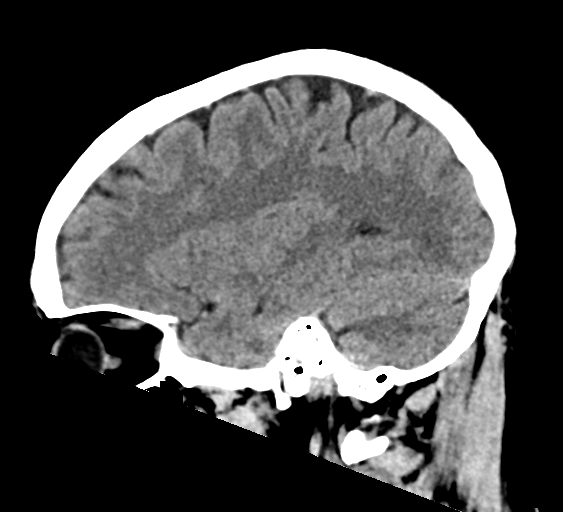

[Series 6: cor soft · coronal · 0.35mm/px · 3 of 70 slices shown]
[im 24/70  brain]
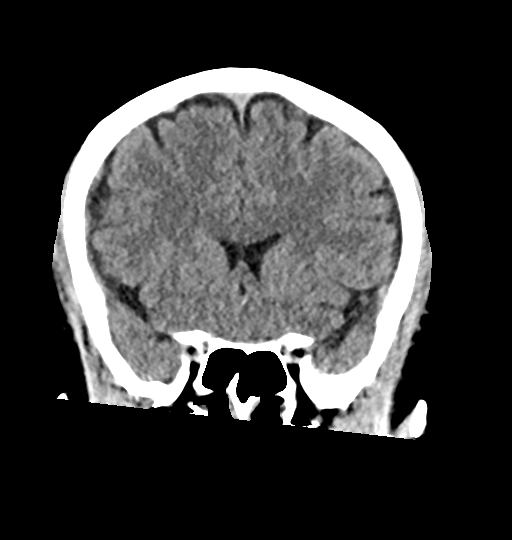
[im 31/70  brain]
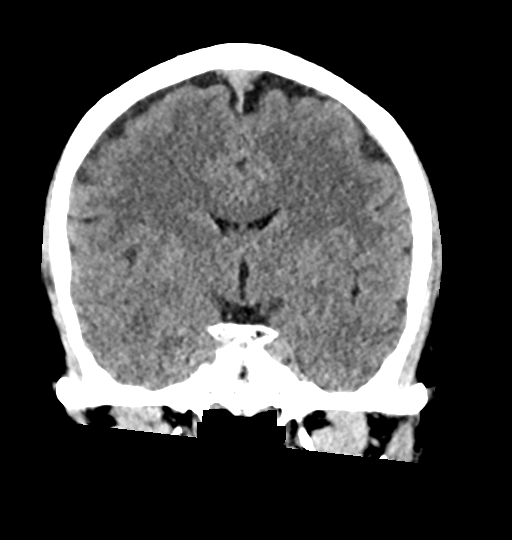
[im 39/70  brain]
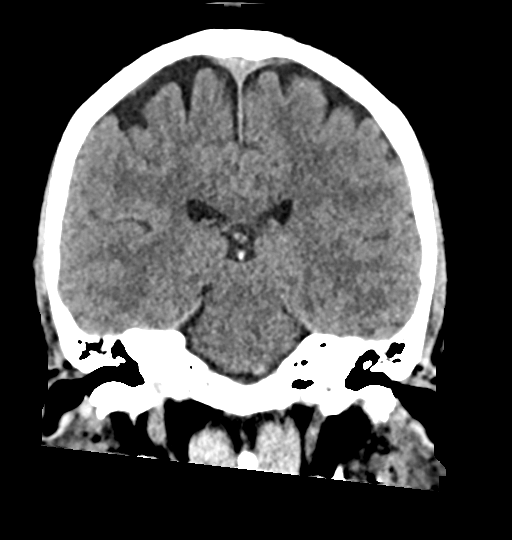

[17 of 47 positions shown; findings below may reference images not displayed]

FINDINGS: Brain:

Cerebral volume is normal.

The pituitary gland appears enlarged, measuring 12 mm in
craniocaudal dimension. This is suspicious for an underlying
pituitary mass.

There is no acute intracranial hemorrhage.

No demarcated cortical infarct.

No extra-axial fluid collection.

No midline shift.

Vascular: No hyperdense vessel.

Skull: Normal. Negative for fracture or focal lesion.

Sinuses/Orbits: Visualized orbits show no acute finding. Mild
mucosal thickening within the bilateral ethmoid and sphenoid sinuses
at the imaged levels.
IMPRESSION: The pituitary gland appears enlarged, measuring 12 mm in
craniocaudal dimension. This is suspicious for an underlying
pituitary mass, and a pituitary protocol brain MRI with contrast is
recommended for further evaluation.

Otherwise unremarkable non-contrast CT appearance of the brain.

Mild mucosal thickening within the bilateral ethmoid and sphenoid
sinuses at the imaged levels.

## 2021-09-11 MED ORDER — PROCHLORPERAZINE EDISYLATE 10 MG/2ML IJ SOLN
10.0000 mg | Freq: Once | INTRAMUSCULAR | Status: AC
  Administered 2021-09-11: 10 mg via INTRAVENOUS
  Filled 2021-09-11: qty 2

## 2021-09-11 MED ORDER — GADOBUTROL 1 MMOL/ML IV SOLN
10.0000 mL | Freq: Once | INTRAVENOUS | Status: AC | PRN
  Administered 2021-09-11: 10 mL via INTRAVENOUS

## 2021-09-11 MED ORDER — PREDNISONE 20 MG PO TABS: ORAL_TABLET | ORAL | 0 refills | Status: DC

## 2021-09-11 MED ORDER — LACTATED RINGERS IV SOLN: INTRAVENOUS | Status: DC

## 2021-09-11 MED ORDER — DIPHENHYDRAMINE HCL 50 MG/ML IJ SOLN
25.0000 mg | Freq: Once | INTRAMUSCULAR | Status: AC
  Administered 2021-09-11: 25 mg via INTRAVENOUS
  Filled 2021-09-11: qty 1

## 2021-09-11 MED ORDER — PREDNISONE 20 MG PO TABS
60.0000 mg | ORAL_TABLET | Freq: Once | ORAL | Status: AC
  Administered 2021-09-11: 60 mg via ORAL
  Filled 2021-09-11: qty 3

## 2021-09-11 NOTE — ED Notes (Signed)
Shepherd Finnan wife [phone] requesting an update

## 2021-09-11 NOTE — ED Notes (Signed)
Patient transported to CT 

## 2021-09-11 NOTE — ED Provider Notes (Signed)
Emergency Medicine Provider Triage Evaluation Note  Derrick Rowe , a 55 y.o. male  was evaluated in triage.  Pt complains of headache, ear ringing and dizziness since Thursday.  This is happened before, but will not this bad."  No diagnosed medical history  Review of Systems  Positive:  Negative: Nausea, vomiting, shortness of breath, chest pain  Physical Exam  BP (!) 88/63 (BP Location: Right Arm)   Pulse 100   Temp 98.8 F (37.1 C) (Oral)   Resp 18   SpO2 100%  Gen:   Awake, no distress   Resp:  Normal effort  MSK:   Moves extremities without difficulty  Other:  Tachycardic and hypotensive.  Cerebellar test within normal limits.  PERRLA.  Medical Decision Making  Medically screening exam initiated at 12:36 PM.  Appropriate orders placed.  Derrick Rowe was informed that the remainder of the evaluation will be completed by another provider, this initial triage assessment does not replace that evaluation, and the importance of remaining in the ED until their evaluation is complete.  Blood pressure 88/63, needs room.  Lab work ordered.   Darliss Ridgel 09/11/21 1237    Godfrey Pick, MD 09/12/21 Dyann Kief

## 2021-09-11 NOTE — ED Triage Notes (Signed)
C/o headache, neck pain, and ringing in bilateral ears x 4 days.  Denies numbness/weakness.

## 2021-09-11 NOTE — ED Provider Notes (Signed)
[facility] EMERGENCY DEPARTMENT Provider Note   CSN: [id number] Arrival date & time: 09/11/21  1142     History Chief Complaint  Patient presents with   Headache    Derrick Rowe is a 55 y.o. male who presents the emergency department for evaluation of a headache.  Patient states that he has been having a persistent headache and dizziness for the last 5 days as well as multiple months of tinnitus.  His headache has been nonresponsive to ibuprofen and Tylenol.  He denies visual deficits, nausea, vomiting, numbness, tingling, weakness or other systemic symptoms.  Patient arrives hypotensive to 88/63 but is alert and oriented answering questions appropriately.   Headache Associated symptoms: no abdominal pain, no back pain, no cough, no ear pain, no eye pain, no fever, no seizures, no sore throat and no vomiting       History reviewed. No pertinent past medical history.  There are no problems to display for this patient.   History reviewed. No pertinent surgical history.     No family history on file.  Social History   Tobacco Use   Smoking status: Every Day    Types: Cigarettes   Smokeless tobacco: Never  Substance Use Topics   Alcohol use: Yes   Drug use: No    Home Medications Prior to Admission medications   Medication Sig Start Date End Date Taking? Authorizing Provider  acetaminophen (TYLENOL) 325 MG tablet Take 650 mg by mouth every 6 (six) hours as needed for moderate pain, headache or fever.   Yes [provider]  ibuprofen (ADVIL,MOTRIN) 600 MG tablet Take 1 tablet (600 mg total) by mouth every 6 (six) hours as needed. 05/31/18  Yes Charlann Lange, PA-C  doxycycline (VIBRAMYCIN) 100 MG capsule Take 1 capsule (100 mg total) by mouth 2 (two) times daily. Patient not taking: Reported on 09/11/2021 05/31/18   Charlann Lange, PA-C  HYDROcodone-acetaminophen (NORCO/VICODIN) 5-325 MG tablet Take 1 tablet by mouth every 4 (four) hours as  needed. Patient not taking: Reported on 09/11/2021 05/31/18   Charlann Lange, PA-C  methocarbamol (ROBAXIN) 500 MG tablet Take 1 tablet (500 mg total) by mouth 2 (two) times daily. Patient not taking: Reported on 09/11/2021 03/30/17   Janne Napoleon, NP  naproxen (NAPROSYN) 375 MG tablet Take 1 tablet (375 mg total) by mouth 2 (two) times daily. Patient not taking: Reported on 09/11/2021 03/30/17   Janne Napoleon, NP  traMADol (ULTRAM) 50 MG tablet Take 1 tablet (50 mg total) by mouth every 6 (six) hours as needed. Patient not taking: Reported on 09/11/2021 03/30/17   Janne Napoleon, NP    Allergies    Patient has no known allergies.  Review of Systems   Review of Systems  Constitutional:  Negative for chills and fever.  HENT:  Positive for tinnitus. Negative for ear pain and sore throat.   Eyes:  Negative for pain and visual disturbance.  Respiratory:  Negative for cough and shortness of breath.   Cardiovascular:  Negative for chest pain and palpitations.  Gastrointestinal:  Negative for abdominal pain and vomiting.  Genitourinary:  Negative for dysuria and hematuria.  Musculoskeletal:  Negative for arthralgias and back pain.  Skin:  Negative for color change and rash.  Neurological:  Positive for light-headedness and headaches. Negative for seizures and syncope.  All other systems reviewed and are negative.  Physical Exam Updated Vital Signs BP (!) 160/83   Pulse 70   Temp 98.8 F (37.1 C) (Oral)  Resp (!) 23   SpO2 98%   Physical Exam Vitals and nursing note reviewed.  Constitutional:      Appearance: He is well-developed.  HENT:     Head: Normocephalic and atraumatic.  Eyes:     Conjunctiva/sclera: Conjunctivae normal.  Cardiovascular:     Rate and Rhythm: Normal rate and regular rhythm.     Heart sounds: No murmur heard. Pulmonary:     Effort: Pulmonary effort is normal. No respiratory distress.     Breath sounds: Normal breath sounds.  Abdominal:     Palpations: Abdomen is  soft.     Tenderness: There is no abdominal tenderness.  Musculoskeletal:     Cervical back: Neck supple.  Skin:    General: Skin is warm and dry.  Neurological:     Mental Status: He is alert.     Cranial Nerves: No cranial nerve deficit.     Sensory: No sensory deficit.     Motor: No weakness.    ED Results / Procedures / Treatments   Labs (all labs ordered are listed, but only abnormal results are displayed) Labs Reviewed  COMPREHENSIVE METABOLIC PANEL - Abnormal; Notable for the following components:      Result Value   Creatinine, Ser 1.28 (*)    Total Bilirubin 1.3 (*)    All other components within normal limits  RESP PANEL BY RT-PCR (FLU A&B, COVID) ARPGX2  CBC WITH DIFFERENTIAL/PLATELET    EKG None  Radiology CT Head Wo Contrast  Result Date: 09/11/2021 CLINICAL DATA:  Headache, new or worsening. Additional history provided: Patient reports headache, neck pain, ringing in bilateral ears for 4 days. EXAM: CT HEAD WITHOUT CONTRAST TECHNIQUE: Contiguous axial images were obtained from the base of the skull through the vertex without intravenous contrast. COMPARISON:  No pertinent prior exams available for comparison. FINDINGS: Brain: Cerebral volume is normal. The pituitary gland appears enlarged, measuring 12 mm in craniocaudal dimension. This is suspicious for an underlying pituitary mass. There is no acute intracranial hemorrhage. No demarcated cortical infarct. No extra-axial fluid collection. No midline shift. Vascular: No hyperdense vessel. Skull: Normal. Negative for fracture or focal lesion. Sinuses/Orbits: Visualized orbits show no acute finding. Mild mucosal thickening within the bilateral ethmoid and sphenoid sinuses at the imaged levels. IMPRESSION: The pituitary gland appears enlarged, measuring 12 mm in craniocaudal dimension. This is suspicious for an underlying pituitary mass, and a pituitary protocol brain MRI with contrast is recommended for further  evaluation. Otherwise unremarkable non-contrast CT appearance of the brain. Mild mucosal thickening within the bilateral ethmoid and sphenoid sinuses at the imaged levels. Electronically Signed   By: Kellie Simmering D.O.   On: 09/11/2021 15:13    Procedures Procedures   Medications Ordered in ED Medications  lactated ringers infusion (0 mLs Intravenous Stopped 09/11/21 1528)  prochlorperazine (COMPAZINE) injection 10 mg (10 mg Intravenous Given 09/11/21 1335)  diphenhydrAMINE (BENADRYL) injection 25 mg (25 mg Intravenous Given 09/11/21 1335)    ED Course  I have reviewed the triage vital signs and the nursing notes.  Pertinent labs & imaging results that were available during my care of the patient were reviewed by me and considered in my medical decision making (see chart for details).    MDM Rules/Calculators/A&P                           Patient seen emergency department for evaluation of headache and tinnitus.  Physical exam is unremarkable  with no focal motor or sensory deficits.  Laboratory evaluation unremarkable outside of a mild creatinine elevation of 1.28, COVID and flu negative.  CT head suspicious for pituitary mass.  MRI is currently pending.  Patient then signed out to oncoming provider.  Please see provider signout note for continuation of work-up.  Although patient did arrive hypotensive, his hypotension resolved with no therapy and a liter of fluids was given to the patient.  Over the 4-hour stay that I evaluated the patient he had no episodes of hypotension. Final Clinical Impression(s) / ED Diagnoses Final diagnoses:  None    Rx / DC Orders ED Discharge Orders     None        Oryon Gary, Debe Coder, MD 09/11/21 1546

## 2021-09-11 NOTE — ED Provider Notes (Signed)
Care transferred to me.  I discussed with endocrinology at [facility] who recommends outpatient follow-up.  Discussed with neurosurgery based on MRI findings, Dr. Marcello Moores.  He recommends steroid taper if patient is still having a headache.  Patient had low blood pressure when he first arrived but otherwise his blood pressures have been high or normal.  Thus I do not think he needs to be admitted and I do not think he is in a adrenal crisis.  We will give the steroid taper and I have ordered labs that Dr. Marcello Moores requested for outpatient evaluation.  Given return precautions.     Sherwood Gambler, MD 09/11/21 [phone]

## 2021-09-11 NOTE — Discharge Instructions (Addendum)
The MRI shows a mass of your pituitary gland.  This can cause significant endocrine abnormalities.  This can also be causing her headaches.  We are going to prescribed you steroids to help with the headaches and you need to follow-up with the endocrinology group at [facility] as well as the neurosurgeon listed.  Call both tomorrow to set up appointments.  If you develop fever, new or worsening headache, dizziness or lightheadedness, low blood pressure, vomiting, or any other new/concerning symptoms then return to the ER for evaluation.

## 2021-09-11 NOTE — ED Notes (Signed)
Patient transported to MRI 

## 2021-09-13 LAB — T3: T3, Total: 124 ng/dL (ref 71–180)

## 2021-09-13 LAB — PROLACTIN: Prolactin: 4.5 ng/mL (ref 4.0–15.2)

## 2021-09-13 LAB — INSULIN-LIKE GROWTH FACTOR: Somatomedin C: 173 ng/mL (ref 68–247)

## 2021-09-19 ENCOUNTER — Other Ambulatory Visit: Payer: Self-pay | Admitting: Neurosurgery

## 2021-09-19 DIAGNOSIS — D496 Neoplasm of unspecified behavior of brain: Secondary | ICD-10-CM

## 2021-09-20 ENCOUNTER — Telehealth: Payer: Self-pay | Admitting: *Deleted

## 2021-09-20 DIAGNOSIS — E272 Addisonian crisis: Secondary | ICD-10-CM

## 2021-09-20 NOTE — Telephone Encounter (Signed)
ED Referral with Disclaimer  Derrick Rowe was seen in the ED and was advised to follow up with you as necessary.  It is the sole responsibility of the patient to arrange for an appointment with you or your practice.  This notification is no way meant to establish a doctor-patient relationship.

## 2021-10-20 ENCOUNTER — Ambulatory Visit
Admission: RE | Admit: 2021-10-20 | Discharge: 2021-10-20 | Disposition: A | Discharge status: Home / Self Care | Payer: Self-pay | Source: Ambulatory Visit | Attending: Neurosurgery | Admitting: Neurosurgery

## 2021-10-20 ENCOUNTER — Other Ambulatory Visit: Payer: Self-pay

## 2021-10-20 DIAGNOSIS — D496 Neoplasm of unspecified behavior of brain: Secondary | ICD-10-CM

## 2021-10-20 IMAGING — MR MR HEAD WO/W CM
11 of 18 series · 31 of 48 positions shown · IV contrast (multihance)
Comparison: [DATE]

CLINICAL DATA: Follow-up pituitary lesions seen five weeks ago.

EXAM:
MRI HEAD WITHOUT AND WITH CONTRAST
TECHNIQUE: Multiplanar, multiecho pulse sequences of the brain and surrounding
structures were obtained without and with intravenous contrast.
CONTRAST:  10mL MULTIHANCE GADOBENATE DIMEGLUMINE 529 MG/ML IV SOLN

[Series 2: T1 · sagittal · 5.0mm · 0.45mm/px · 3 of 21 slices shown (1 of 3)]
[im 1/21]
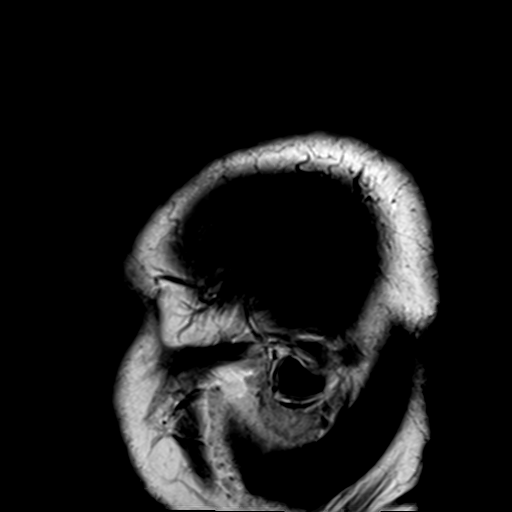
[im 11/21]
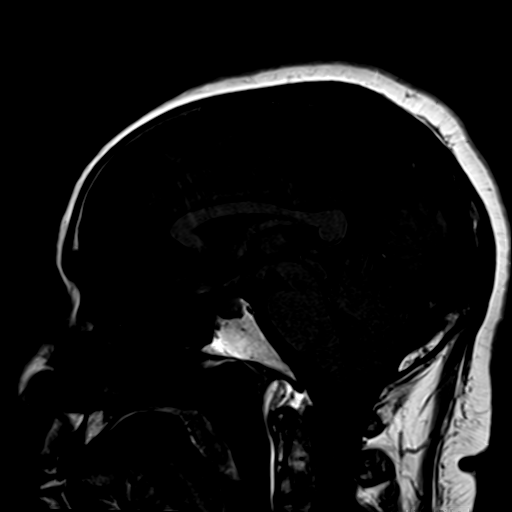
[im 21/21]
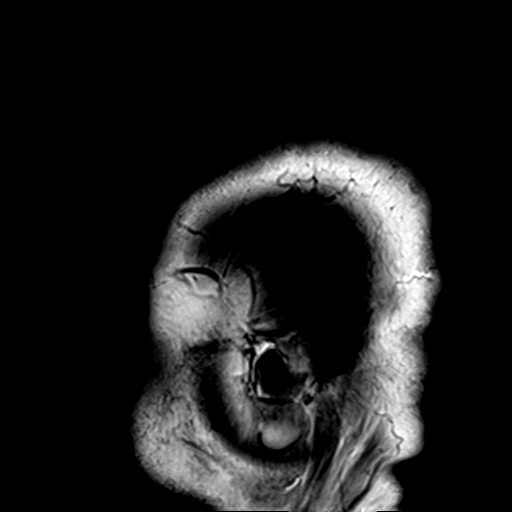

[Series 3: DWI · axial · 3.0mm · 1.80mm/px · z∈[-55,+98]mm · 9 of 104 slices shown]
[im 1/104]
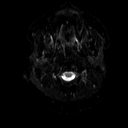
[im 19/104]
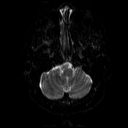
[im 29/104]
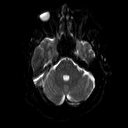
[im 47/104]
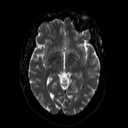
[im 57/104]
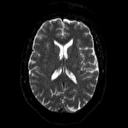
[im 75/104]
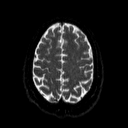
[im 85/104]
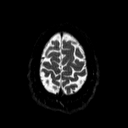
[im 94/104]
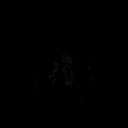
[im 104/104]
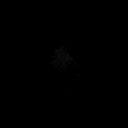

[Series 4: dwi_adc · axial · 3.0mm · 1.80mm/px · z∈[-55,+98]mm · 5 of 50 slices shown]
[im 1/50]
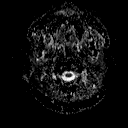
[im 13/50]
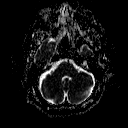
[im 25/50]
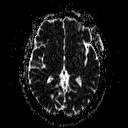
[im 37/50]
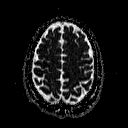
[im 50/50]
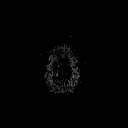

[Series 5: T2 · axial · 5.0mm · 0.36mm/px · z∈[-67,+81]mm · 3 of 24 slices shown]
[im 1/24]
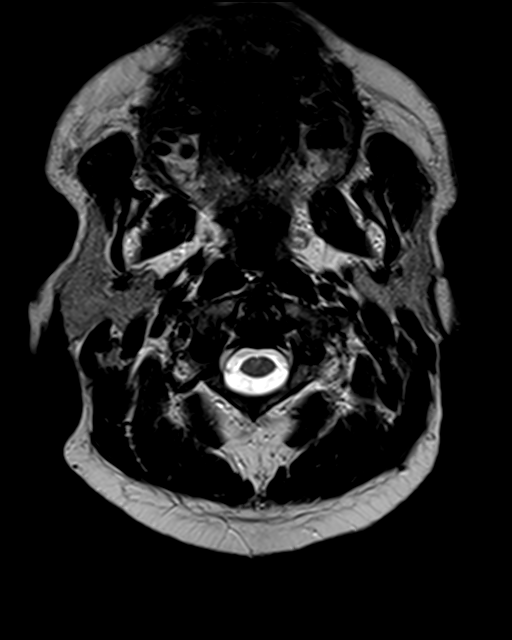
[im 12/24]
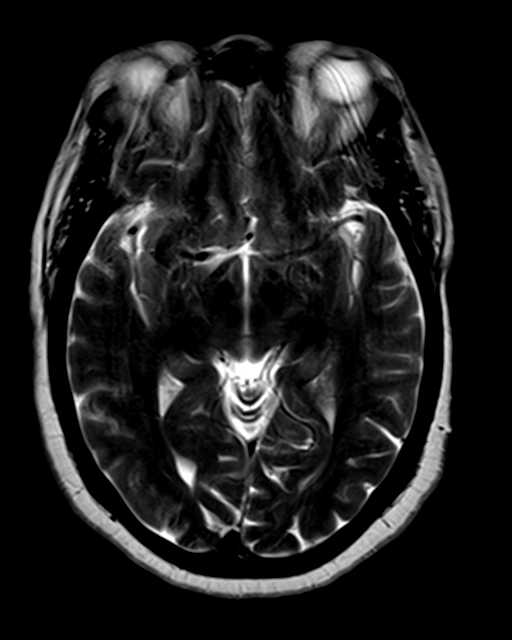
[im 24/24]
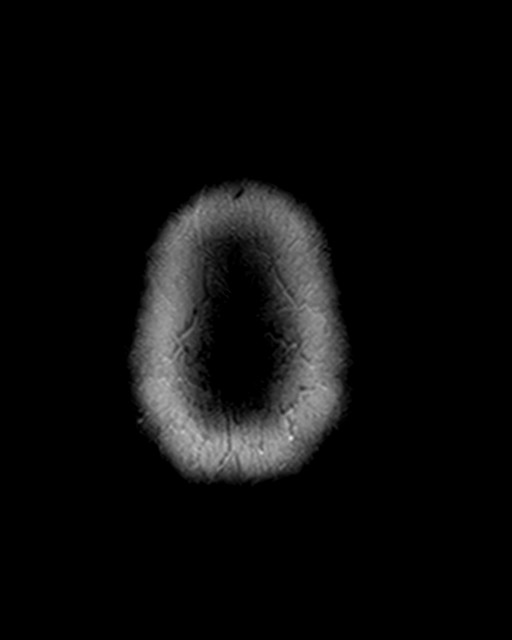

[Series 6: FLAIR · axial · 3.0mm · 0.45mm/px · z∈[-61,+73]mm · 3 of 30 slices shown]
[im 1/30]
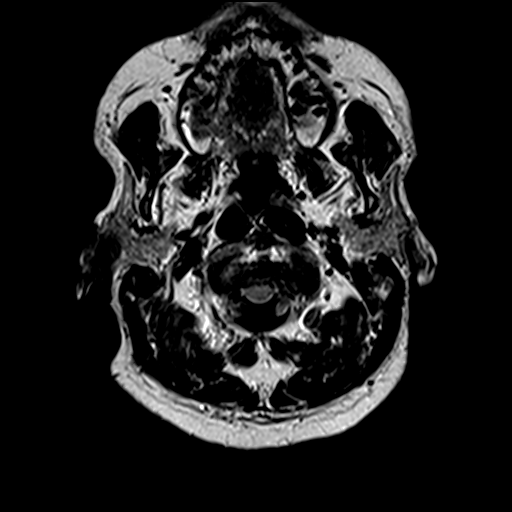
[im 15/30]
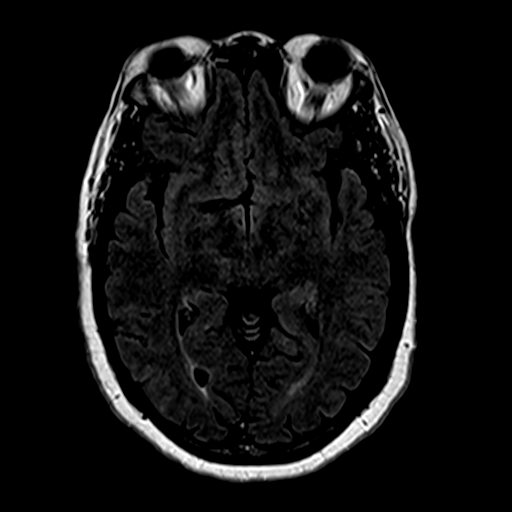
[im 30/30]
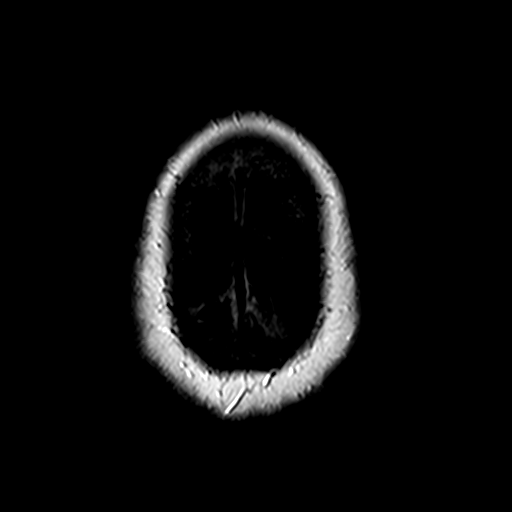

[Series 9: T1 · sagittal · 3.0mm · 0.33mm/px · 1 of 11 slices shown (2 of 3)]
[im 1/11]
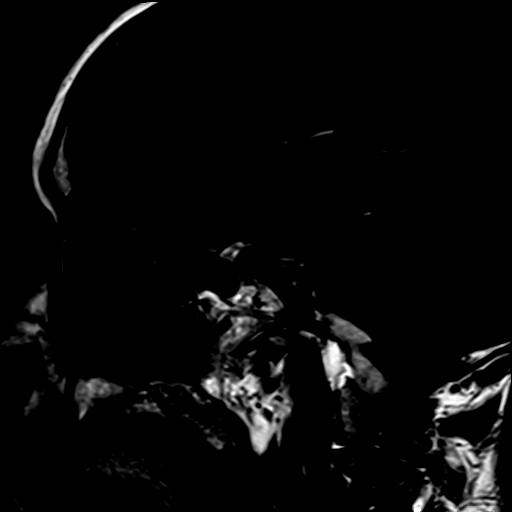

[Series 10: T1 · coronal · 3.0mm · 0.33mm/px · 1 of 11 slices shown (3 of 3)]
[im 1/11]
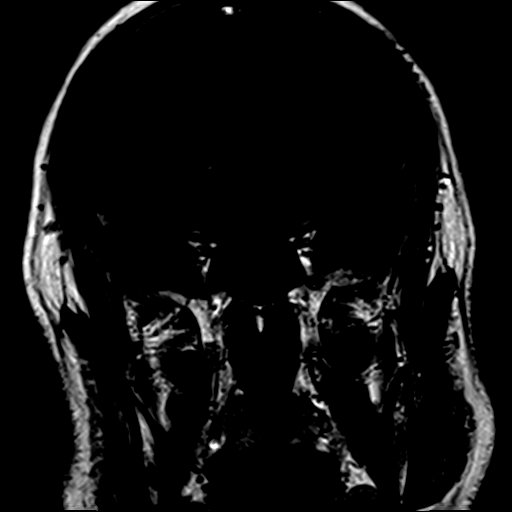

[Series 11: pre cor dynamic · coronal · non-contrast · 3.0mm · 0.35mm/px · 1 of 9 slices shown]
[im 1/9]
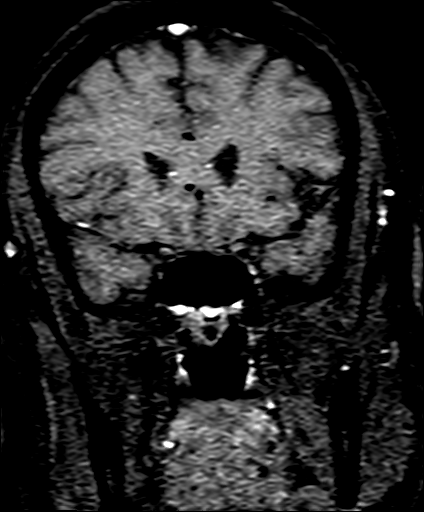

[Series 18: T1 post-contrast · sagittal · 3.0mm · 0.33mm/px · 1 of 11 slices shown (1 of 3)]
[im 1/11]
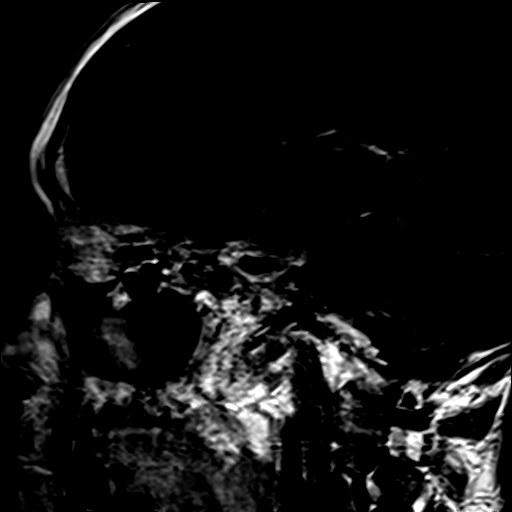

[Series 19: T1 post-contrast · coronal · 3.0mm · 0.33mm/px · 1 of 11 slices shown (2 of 3)]
[im 1/11]
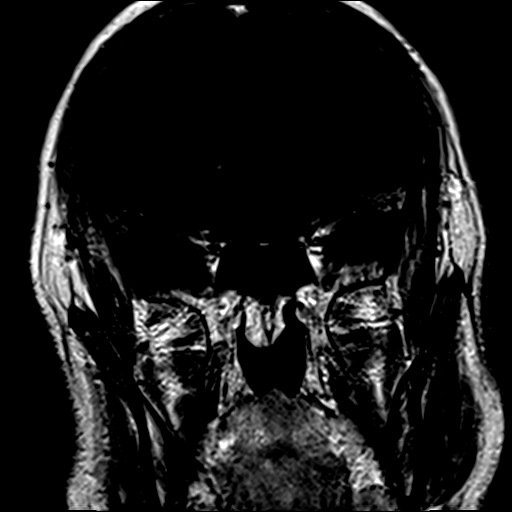

[Series 21: T1 post-contrast · coronal · 5.0mm · 0.45mm/px · 3 of 29 slices shown (3 of 3)]
[im 1/29]
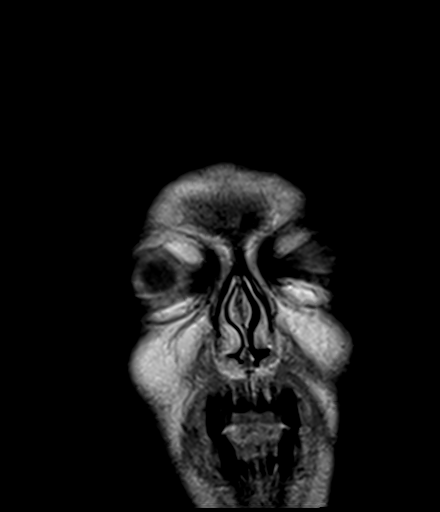
[im 15/29]
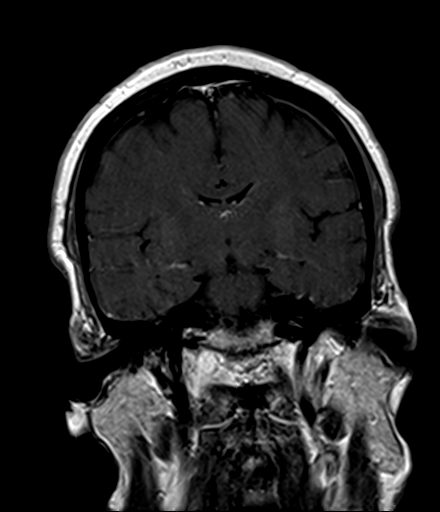
[im 29/29]
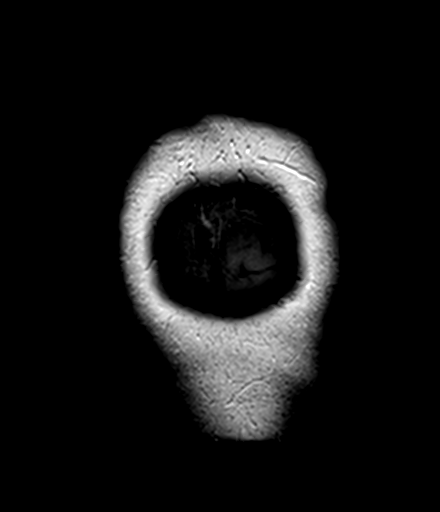

[31 of 48 positions shown; findings below may reference images not displayed]

FINDINGS: Brain: The brain itself has a normal appearance without evidence of
old or acute infarction, mass lesion, hemorrhage, hydrocephalus or
extra-axial collection.

The pituitary gland measures approximately 12.2 x 7.3 x 21.4 mm.
Previously seen T1 hyperintensity has resolved. The gland is
probably slightly smaller. The internal architecture of the gland is
heterogeneous. The findings are most consistent with resolving
pituitary apoplexy. I cannot completely rule out the possibility of
an underlying adenoma. If present, this may be on the right. In any
case, I think additional follow-up scanning would be warranted and
would suggest a follow-up interval of 6 months.

Vascular: Major vessels at the base of the brain show flow.

Skull and upper cervical spine: Negative

Sinuses/Orbits: Clear/normal

Other: None
IMPRESSION: Previously seen pituitary T1 hyperintensity has resolved. The gland
is slightly smaller. The internal architecture remains
heterogeneous. Findings are most consistent with resolving pituitary
apoplexy. It is possible that there could be an underlying pituitary
adenoma, possibly to the right of midline, but I do not think that
can be stated with certainty at this point. I would suggest an
additional follow-up study in 6 months, again using pituitary detail
protocol.

The brain itself is otherwise normal.

## 2021-10-20 IMAGING — CT CT ANGIO HEAD
2 of 4 series · 6 of 30 positions shown · IV contrast (iopamidol)
Comparison: MRI [DATE] and same day.

CLINICAL DATA: Pituitary tumor found on previous exams.  Headache.

EXAM:
CT ANGIOGRAPHY HEAD
TECHNIQUE: Multidetector CT imaging of the head was performed using the
standard protocol during bolus administration of intravenous
contrast. Multiplanar CT image reconstructions and MIPs were
obtained to evaluate the vascular anatomy.
CONTRAST:  75mL [6Y] IOPAMIDOL ([6Y]) INJECTION 76%

[Series 3: head w/(date) · axial · 0.43mm/px · z∈[-76,-21]mm · 2 of 34 slices shown]
[im 12/34  brain]
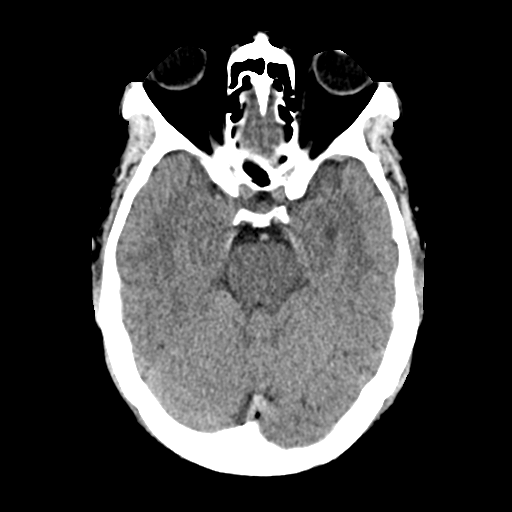
[im 23/34  brain]
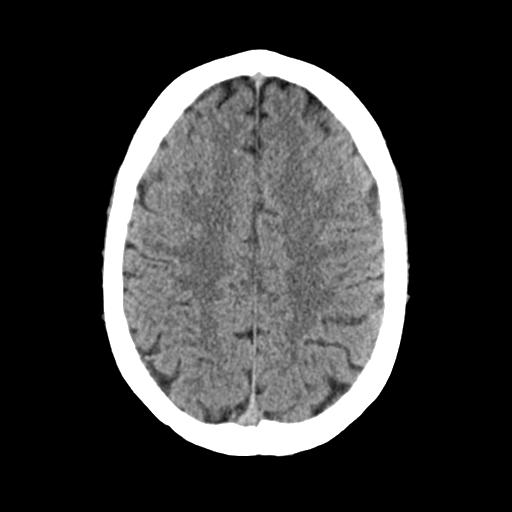

[Series 7: head angio · axial · 0.46mm/px · z∈[-103,-4]mm · 4 of 57 slices shown]
[im 12/57  brain]
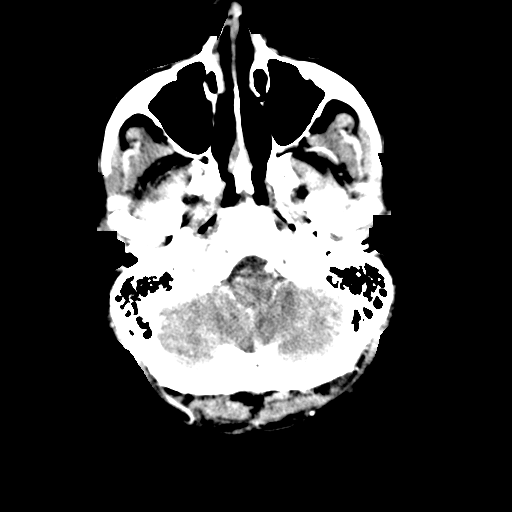
[im 23/57  bone]
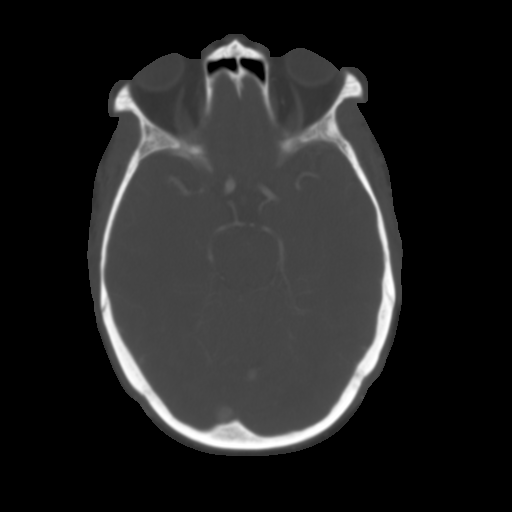
[im 34/57  brain]
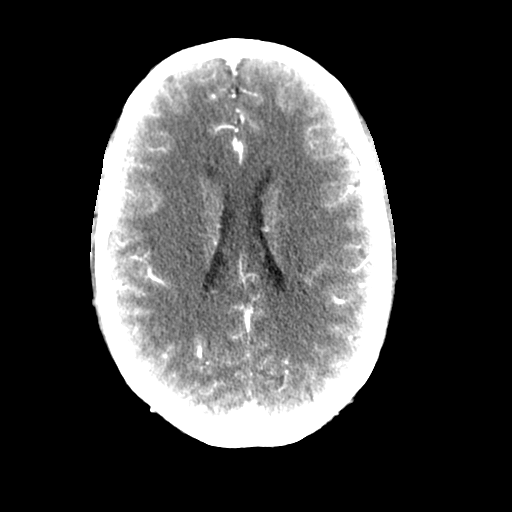
[im 45/57  bone]
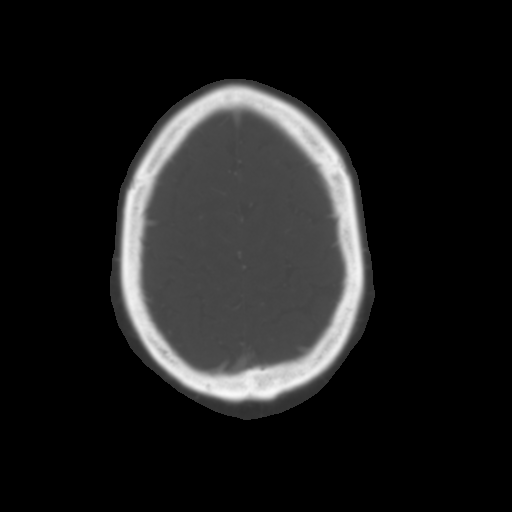

[6 of 30 positions shown; findings below may reference images not displayed]

FINDINGS: CT HEAD

Brain: The brain itself has a normal appearance without evidence of
atrophy, old or acute infarction, intra-axial mass lesion,
hemorrhage, hydrocephalus or extra-axial collection. No pituitary or
sellar abnormality visible by standard head CT.

Vascular: No abnormal vascular finding.

Skull: Normal

Sinuses: Clear

Other: None

CTA HEAD

Anterior circulation: Both internal carotid arteries are patent
through the skull base and siphon regions. Minimal siphon
atherosclerotic calcification but no stenosis. The anterior and
middle cerebral vessels are normal.

Posterior circulation: Both vertebral arteries widely patent through
the foramen magnum to the basilar. The right vertebral artery is
dominant. No basilar stenosis. Posterior circulation branch vessels
appear normal. Patent bilateral posterior communicating arteries.

Venous sinuses: Patent and normal.

Anatomic variants: None significant.

Review of the MIP images confirms the above findings.
IMPRESSION: Normal head CT today.  No visible pituitary abnormality.

Normal intracranial CT angiography. No evidence of aneurysm,
stenosis or occlusion.

## 2021-10-20 MED ORDER — IOPAMIDOL (ISOVUE-370) INJECTION 76%
75.0000 mL | Freq: Once | INTRAVENOUS | Status: AC | PRN
  Administered 2021-10-20: 11:00:00 75 mL via INTRAVENOUS

## 2021-10-20 MED ORDER — GADOBENATE DIMEGLUMINE 529 MG/ML IV SOLN
10.0000 mL | Freq: Once | INTRAVENOUS | Status: AC | PRN
  Administered 2021-10-20: 12:00:00 10 mL via INTRAVENOUS

## 2022-02-28 ENCOUNTER — Ambulatory Visit: Payer: BC Managed Care – PPO | Admitting: Internal Medicine

## 2022-03-24 ENCOUNTER — Other Ambulatory Visit: Payer: Self-pay | Admitting: Neurosurgery

## 2022-03-24 DIAGNOSIS — E236 Other disorders of pituitary gland: Secondary | ICD-10-CM

## 2022-04-19 ENCOUNTER — Ambulatory Visit
Admission: RE | Admit: 2022-04-19 | Discharge: 2022-04-19 | Disposition: A | Discharge status: Home / Self Care | Payer: BC Managed Care – PPO | Source: Ambulatory Visit | Attending: Neurosurgery | Admitting: Neurosurgery

## 2022-04-19 DIAGNOSIS — E236 Other disorders of pituitary gland: Secondary | ICD-10-CM

## 2022-04-19 IMAGING — MR MR HEAD WO/W CM
13 of 19 series · 35 of 48 positions shown · IV contrast (10 ml Multihance)
Comparison: Brain MRI [DATE]

CLINICAL DATA: Pituitary mass, headaches in the occipital lobes for
6 months

EXAM:
MRI HEAD WITHOUT AND WITH CONTRAST
TECHNIQUE: Multiplanar, multiecho pulse sequences of the brain and surrounding
structures were obtained without and with intravenous contrast.
CONTRAST:  10mL MULTIHANCE GADOBENATE DIMEGLUMINE 529 MG/ML IV SOLN

[Series 2: T1 · sagittal · 5.0mm · 0.49mm/px · 2 of 23 slices shown (1 of 3)]
[im 1/23]
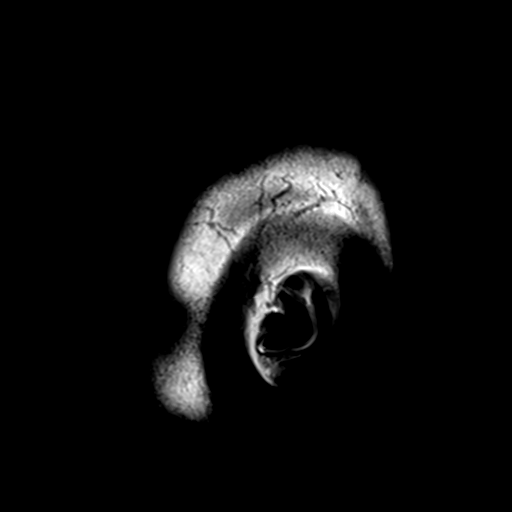
[im 23/23]
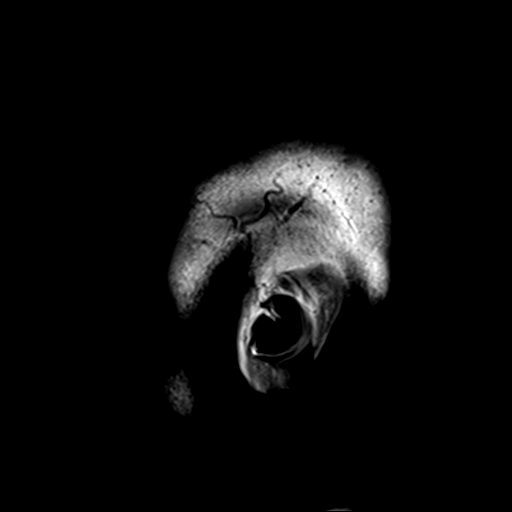

[Series 3: DWI · axial · 3.0mm · 1.80mm/px · z∈[-56,+90]mm · 11 of 100 slices shown]
[im 1/100]
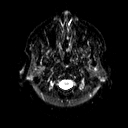
[im 10/100]
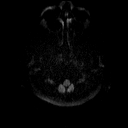
[im 20/100]
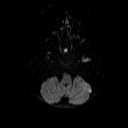
[im 30/100]
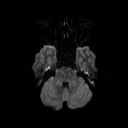
[im 40/100]
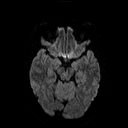
[im 50/100]
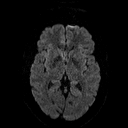
[im 60/100]
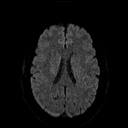
[im 70/100]
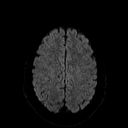
[im 80/100]
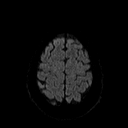
[im 90/100]
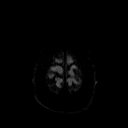
[im 100/100]
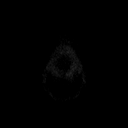

[Series 4: ax dwi_adc · axial · 3.0mm · 1.80mm/px · z∈[-56,+90]mm · 5 of 50 slices shown]
[im 1/50]
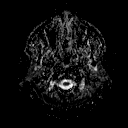
[im 13/50]
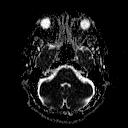
[im 25/50]
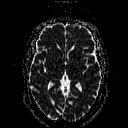
[im 37/50]
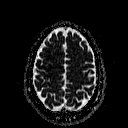
[im 50/50]
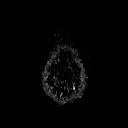

[Series 5: T2 · axial · 5.0mm · 0.36mm/px · z∈[-62,+94]mm · 3 of 25 slices shown (1 of 2)]
[im 1/25]
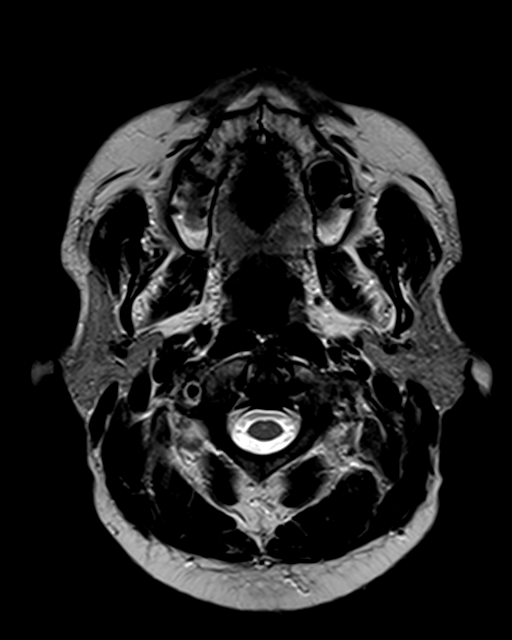
[im 13/25]
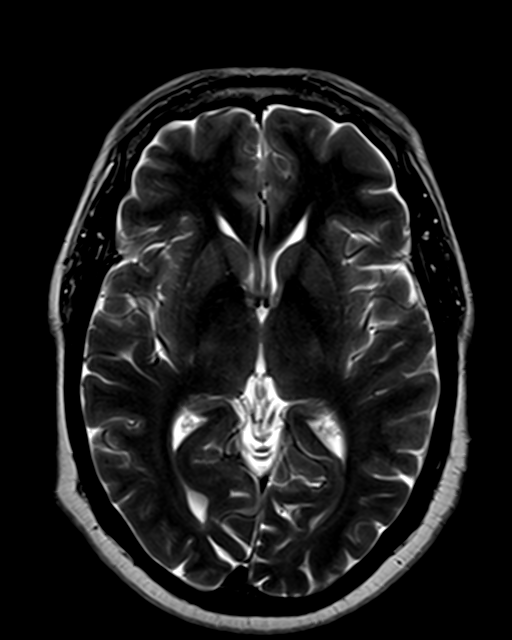
[im 25/25]
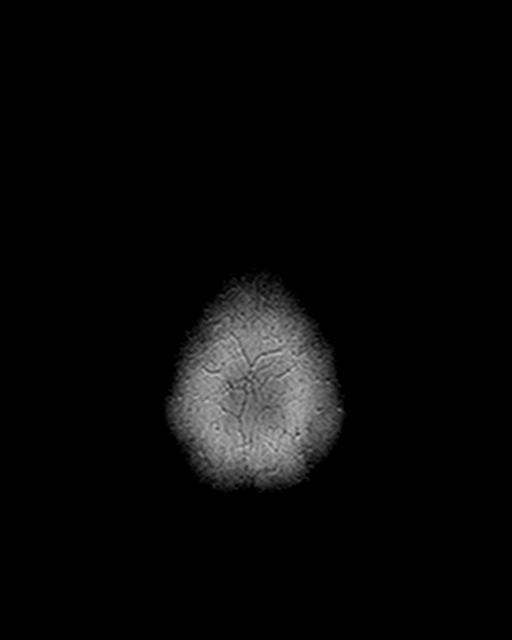

[Series 6: FLAIR · axial · 3.0mm · 0.45mm/px · z∈[-56,+88]mm · 3 of 32 slices shown]
[im 1/32]
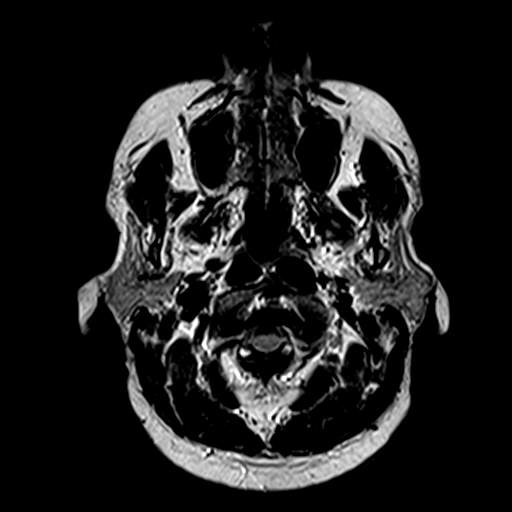
[im 16/32]
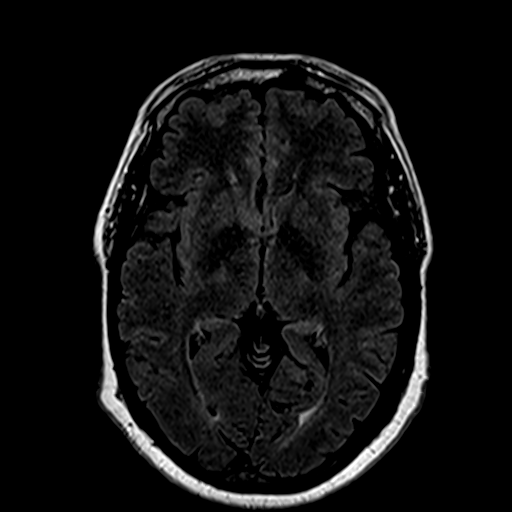
[im 32/32]
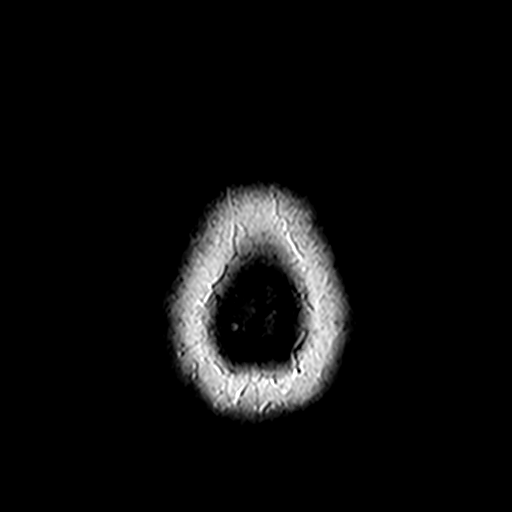

[Series 8: swi_images · axial · 4.0mm · 0.94mm/px · z∈[-53,+86]mm · 4 of 36 slices shown]
[im 1/36]
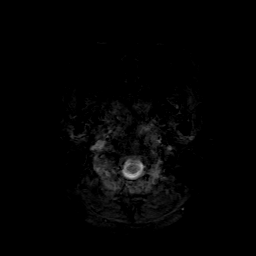
[im 12/36]
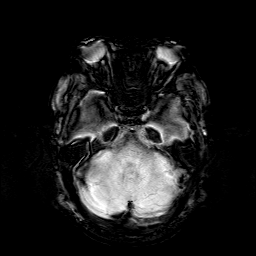
[im 24/36]
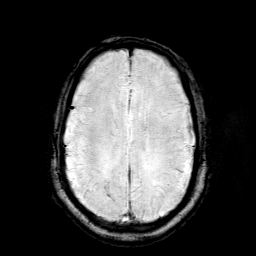
[im 36/36]
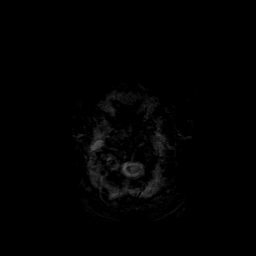

[Series 9: T1 · sagittal · 3.0mm · 0.33mm/px · 1 of 13 slices shown (2 of 3)]
[im 1/13]
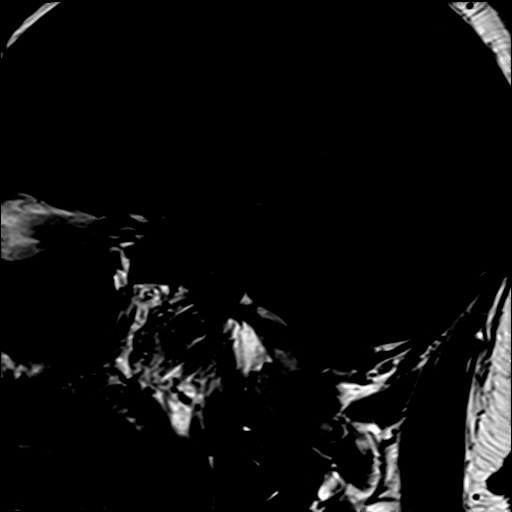

[Series 10: T1 · coronal · 3.0mm · 0.33mm/px · 1 of 11 slices shown (3 of 3)]
[im 1/11]
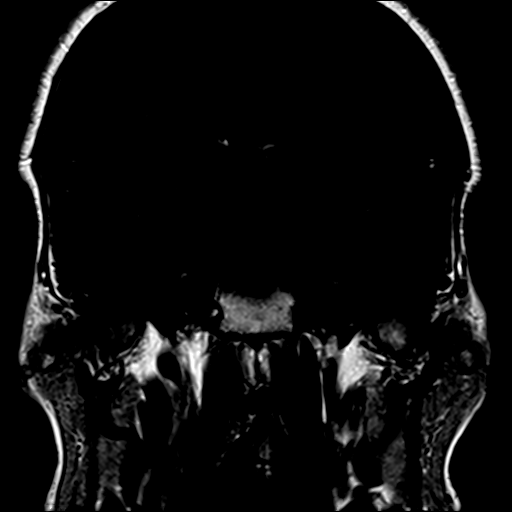

[Series 11: T2 · coronal · 3.0mm · 0.23mm/px · 1 of 11 slices shown (2 of 2)]
[im 1/11]
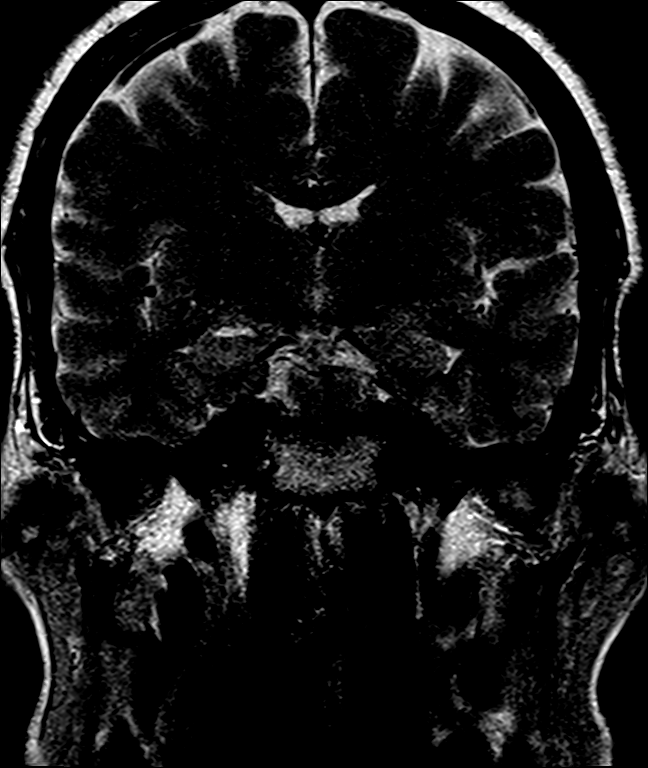

[Series 12: pre cor dynamic · coronal · non-contrast · 3.0mm · 0.35mm/px · 1 of 8 slices shown]
[im 1/8]
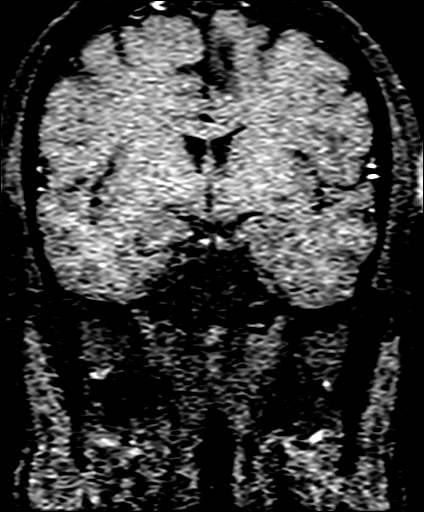

[Series 13: post fs cor · coronal · 3.0mm · 0.35mm/px · 1 of 8 slices shown]
[im 1/8]
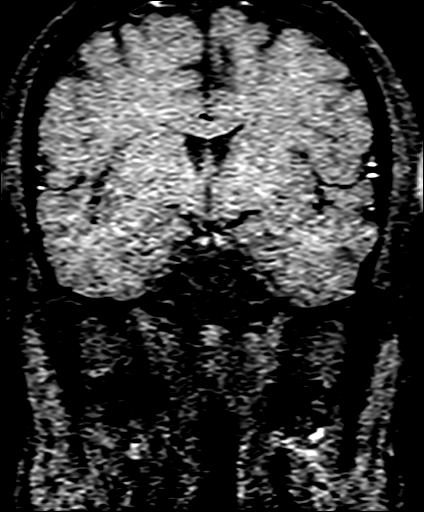

[Series 19: T1 post-contrast · sagittal · 3.0mm · 0.33mm/px · 1 of 13 slices shown (1 of 2)]
[im 1/13]
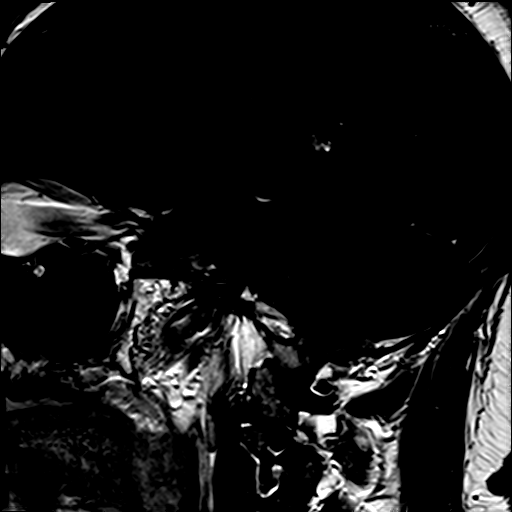

[Series 20: T1 post-contrast · coronal · 3.0mm · 0.33mm/px · 1 of 11 slices shown (2 of 2)]
[im 1/11]
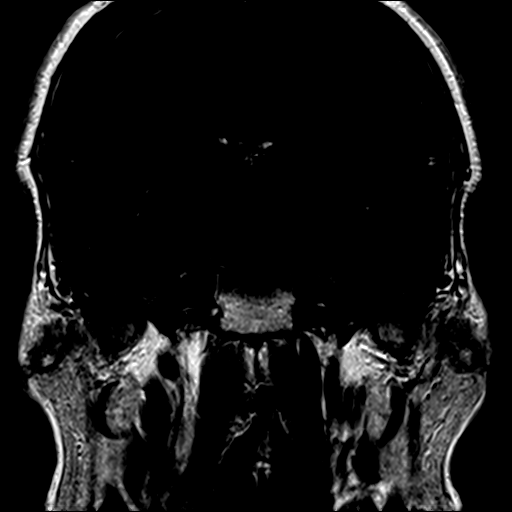

[35 of 48 positions shown; findings below may reference images not displayed]

FINDINGS: Brain: There is no acute intracranial hemorrhage, extra-axial fluid
collection, or acute infarct.

Parenchymal volume is normal. The ventricles are normal in size.
Gray-white differentiation is preserved. There is a minimal burden
of FLAIR signal abnormality in the supratentorial brain. There is no
suspicious parenchymal signal abnormality. There is no abnormal
enhancement. There is no mass effect or midline shift.

Pituitary/Sella: There is a persistent focus of hypoenhancement in
the right aspect of the sella measuring up to 8 mm by 7 mm in the
coronal plane (20-6). The lesion mildly extends over the superior
margin of the right cavernous ICA without evidence of encasement
(20-6). There is slight leftward deviation of the pituitary stalk.

Vascular: Normal flow voids.

Skull and upper cervical spine: Normal marrow signal.

Sinuses/Orbits: The paranasal sinuses are clear. The globes and
orbits are unremarkable.

Other: None.
IMPRESSION: Persistent focus of hypoenhancement in the right aspect of the sella
measuring up to 8 mm with slight leftward deviation of the pituitary
stalk. Findings are suspicious for a true microadenoma which may
have been hemorrhagic at the time of initial presentation on
[DATE].

## 2022-04-19 MED ORDER — GADOBENATE DIMEGLUMINE 529 MG/ML IV SOLN
10.0000 mL | Freq: Once | INTRAVENOUS | Status: AC | PRN
  Administered 2022-04-19: 10 mL via INTRAVENOUS

## 2022-09-06 ENCOUNTER — Other Ambulatory Visit: Payer: Self-pay | Admitting: Internal Medicine

## 2022-09-06 DIAGNOSIS — N521 Erectile dysfunction due to diseases classified elsewhere: Secondary | ICD-10-CM

## 2022-09-06 DIAGNOSIS — D352 Benign neoplasm of pituitary gland: Secondary | ICD-10-CM

## 2022-09-06 DIAGNOSIS — R7989 Other specified abnormal findings of blood chemistry: Secondary | ICD-10-CM

## 2022-09-30 ENCOUNTER — Other Ambulatory Visit: Payer: BC Managed Care – PPO

## 2022-10-21 ENCOUNTER — Ambulatory Visit
Admission: RE | Admit: 2022-10-21 | Discharge: 2022-10-21 | Disposition: A | Discharge status: Home / Self Care | Payer: BC Managed Care – PPO | Source: Ambulatory Visit | Attending: Internal Medicine | Admitting: Internal Medicine

## 2022-10-21 DIAGNOSIS — R7989 Other specified abnormal findings of blood chemistry: Secondary | ICD-10-CM

## 2022-10-21 DIAGNOSIS — D352 Benign neoplasm of pituitary gland: Secondary | ICD-10-CM

## 2022-10-21 DIAGNOSIS — N521 Erectile dysfunction due to diseases classified elsewhere: Secondary | ICD-10-CM

## 2022-10-21 MED ORDER — GADOPICLENOL 0.5 MMOL/ML IV SOLN
5.0000 mL | Freq: Once | INTRAVENOUS | Status: AC | PRN
  Administered 2022-10-21: 5 mL via INTRAVENOUS

## 2023-01-05 ENCOUNTER — Other Ambulatory Visit: Payer: Self-pay | Admitting: Neurosurgery

## 2023-01-05 DIAGNOSIS — Q282 Arteriovenous malformation of cerebral vessels: Secondary | ICD-10-CM

## 2023-02-02 ENCOUNTER — Other Ambulatory Visit: Payer: BC Managed Care – PPO

## 2023-03-02 ENCOUNTER — Ambulatory Visit
Admission: RE | Admit: 2023-03-02 | Discharge: 2023-03-02 | Disposition: A | Discharge status: Home / Self Care | Payer: BC Managed Care – PPO | Source: Ambulatory Visit | Attending: Neurosurgery | Admitting: Neurosurgery

## 2023-03-02 DIAGNOSIS — Q282 Arteriovenous malformation of cerebral vessels: Secondary | ICD-10-CM

## 2023-03-02 MED ORDER — IOPAMIDOL (ISOVUE-370) INJECTION 76%
75.0000 mL | Freq: Once | INTRAVENOUS | Status: AC | PRN
  Administered 2023-03-02: 75 mL via INTRAVENOUS

## 2023-09-24 ENCOUNTER — Encounter: Payer: Self-pay | Admitting: Family Medicine

## 2023-09-25 ENCOUNTER — Other Ambulatory Visit: Payer: Self-pay

## 2023-09-25 DIAGNOSIS — Z720 Tobacco use: Secondary | ICD-10-CM

## 2023-11-05 ENCOUNTER — Other Ambulatory Visit: Payer: BC Managed Care – PPO

## 2023-12-10 ENCOUNTER — Ambulatory Visit
Admission: RE | Admit: 2023-12-10 | Discharge: 2023-12-10 | Disposition: A | Discharge status: Home / Self Care | Payer: BC Managed Care – PPO | Source: Ambulatory Visit

## 2023-12-10 DIAGNOSIS — Z720 Tobacco use: Secondary | ICD-10-CM

## 2024-09-08 NOTE — Progress Notes (Deleted)
    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 History of Present Illness Derrick Rowe is a 58 y.o. male referred by Cork Pagan, NP for evaluation of symptomatic hypotension. Pt was recently seen by primary care with BP 70/40. Midodrine was started and he was referred to Cardiology for evaluation.     ROS-See HPI***    Studies Reviewed:       Results LABS   Total cholesterol: 163 (08/13/2024)   Triglycerides: 156 (08/13/2024)   HDL: 27 (08/13/2024)   LDL: 108 (08/13/2024)   Creatinine: 1.17 (08/13/2024)   eGFR: 71 (08/13/2024)   Potassium: 4.6 (08/13/2024)   Albumin: 4.3 (08/13/2024)   ALT: 10 (08/13/2024)   Sodium: 140 (08/13/2024)   Calcium: 9.2 (08/13/2024)   Hemoglobin: 14.6 (08/13/2024)   MCV: 96 (08/13/2024)   Platelet count: 204,000 (08/13/2024)   HbA1c: 5.7 (08/13/2024)   Urinalysis: 2+ proteinuria (08/13/2024)   Cortisol: 5.4 (07/27/2023)   Results  Risk Assessment/Calculations: {Does this patient have ATRIAL FIBRILLATION?:[phone]} No BP recorded.  {Refresh Note OR Click here to enter BP  :1}***      Physical Exam:  VS:  There were no vitals taken for this visit.       Wt Readings from Last 3 Encounters:  05/30/18 250 lb (113.4 kg)  04/10/14 248 lb (112.5 kg)    Physical Exam***     Assessment and Plan:    Assessment & Plan Hypotension, unspecified hypotension type  Assessment and Plan Assessment & Plan    {       :1}    {Are you ordering a CV Procedure (e.g. stress test, cath, DCCV, TEE, etc)?   Press F2        :[id number]}  Dispo:  No follow-ups on file.  Signed, Glendia Ferrier, PA-C

## 2024-09-09 ENCOUNTER — Ambulatory Visit: Admitting: Physician Assistant

## 2024-09-09 DIAGNOSIS — I959 Hypotension, unspecified: Secondary | ICD-10-CM

## 2024-09-10 ENCOUNTER — Encounter: Payer: Self-pay | Admitting: Physician Assistant

## 2024-09-10 ENCOUNTER — Ambulatory Visit (INDEPENDENT_AMBULATORY_CARE_PROVIDER_SITE_OTHER): Admitting: Physician Assistant

## 2024-09-10 ENCOUNTER — Ambulatory Visit
Admission: RE | Admit: 2024-09-10 | Discharge: 2024-09-10 | Disposition: A | Discharge status: Home / Self Care | Source: Ambulatory Visit | Attending: Physician Assistant | Admitting: Physician Assistant

## 2024-09-10 VITALS — BP 92/55 | HR 81 | Ht 72.0 in | Wt 217.0 lb

## 2024-09-10 DIAGNOSIS — R55 Syncope and collapse: Secondary | ICD-10-CM

## 2024-09-10 DIAGNOSIS — I959 Hypotension, unspecified: Secondary | ICD-10-CM

## 2024-09-10 LAB — ECHOCARDIOGRAM COMPLETE
Area-P 1/2: 5.23 cm2
Height: 72 in
S' Lateral: 3.4 cm
Weight: 3472 [oz_av]

## 2024-09-10 NOTE — Patient Instructions (Signed)
 Medication Instructions:  Your physician recommends that you continue on your current medications as directed. Please refer to the Current Medication list given to you today.  *If you need a refill on your cardiac medications before your next appointment, please call your pharmacy*  Lab Work: TODAY;  ACTH, ADH, CORTISOL, & TSH  If you have labs (blood work) drawn today and your tests are completely normal, you will receive your results only by: MyChart Message (if you have MyChart) OR A paper copy in the mail If you have any lab test that is abnormal or we need to change your treatment, we will call you to review the results.  Testing/Procedures: Your physician has requested that you have an echocardiogram. Echocardiography is a painless test that uses sound waves to create images of your heart. It provides your doctor with information about the size and shape of your heart and how well your heart's chambers and valves are working. This procedure takes approximately one hour. There are no restrictions for this procedure. Please do NOT wear cologne, perfume, aftershave, or lotions (deodorant is allowed). Please arrive 15 minutes prior to your appointment time.  Please note: We ask at that you not bring children with you during ultrasound (echo/ vascular) testing. Due to room size and safety concerns, children are not allowed in the ultrasound rooms during exams. Our front office staff cannot provide observation of children in our lobby area while testing is being conducted. An adult accompanying a patient to their appointment will only be allowed in the ultrasound room at the discretion of the ultrasound technician under special circumstances. We apologize for any inconvenience.   Follow-Up: At [facility], you and your health needs are our priority.  As part of our continuing mission to provide you with exceptional heart care, our providers are all part of one team.  This team  includes your primary Cardiologist (physician) and Advanced Practice Providers or APPs (Physician Assistants and Nurse Practitioners) who all work together to provide you with the care you need, when you need it.  Your next appointment:   As needed   Provider:   Hao Meng, PA-C          We recommend signing up for the patient portal called MyChart.  Sign up information is provided on this After Visit Summary.  MyChart is used to connect with patients for Virtual Visits (Telemedicine).  Patients are able to view lab/test results, encounter notes, upcoming appointments, etc.  Non-urgent messages can be sent to your provider as well.   To learn more about what you can do with MyChart, go to [email or web address].   Other Instructions

## 2024-09-10 NOTE — Progress Notes (Addendum)
 Cardiology Office Note   Date:  09/11/2024  ID:  Derrick Rowe, DOB 12-20-1965, MRN [id number] PCP: Cork Pagan, NP  [facility] Providers Cardiologist:  [facility] - Dr. Kriste    History of Present Illness Derrick Rowe is a 58 y.o. male with past medical history of pituitary adenoma with low ACTH, low testosterone, ED, hyperlipidemia, prediabetes, tobacco use and BPH.  Patient was previously followed by [facility] endocrinology service.  There was concern for pituitary apoplexy in December 2022 endocrine consult, however there was no evidence of hypocortisolism and hypothyroidism.  Only medication patient take is atorvastatin 20 mg daily at home.  He was recently seen by his PCP at which time he complained of low blood pressure at home.  He also reported he has been off of his statins for 1 to 2 months.  Blood pressure was 70/40 in the clinic that day.  He had persistent low reading and was lightheaded when he stood up.  He was started on midodrine 2.5 mg twice a day by PCP.  He was also encouraged to have adequate hydration and increase salt intake.  He was referred to cardiology service for evaluation of hypotension the cardiovascular risk.  Patient presents today for [facility] evaluation of low blood pressure.  He denies any significant dizziness or feeling of passing out.  He has never passed out in the past.  He works as a naval architect and often drive to [place]  or [place] .  He has never developed a blood clot before.  He denies any leg swelling.  Per patient, his blood pressure has been persistently low for several years now.  However at least during his last visit with Dr. Norman Glendia Hummer his endocrinologist in September of last year, blood pressure was in the 120s in the doctor's office.  The only time he ever felt some dizziness is after a prolonged drive and when he get off of his truck, he would feel some dizziness but otherwise, he has no orthostatic dizziness at baseline.   Despite so, his orthostatic vital signs quite positive.  Blood pressure dropped from 95/59 down to 63/32.  He was started on midodrine 2.5 mg twice a day, however the medication has not made any impact on his blood pressure.  He has a chronic headache that goes from the base of his neck up to the backside of his skull.  The symptom is intermittent.  There was no hyponatremia based on the recent blood work.  However with the headache, I do think the patient will need an MRI of the brain to rule out possibility of pituitary apoplexy.  I will initiate his workup including ACTH level, vasopressin, TSH and a cortisol level.  I discussed his case with Dr. 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  I will obtain echocardiogram to make sure his EF is normal, if echocardiogram is normal, I would defer additional evaluation by endocrinology service.  I am not confident that increasing his midodrine without treating underlying cause will have significant impact on his overall condition since he is largely asymptomatic.  If his endocrinologist feels he is current condition is not related to severe hypopituitarism, then may consider increasing midodrine to 5 mg 3 times a day or even 10 mg 3 times a day dosing.  However if the cause is due to severe hypopituitarism, addition of Florinef is unlikely to help without treating the underlying cause.  Will follow-up on  his endocrine workup.  ROS:   He denies chest pain, palpitations, dyspnea, pnd, orthopnea, n, v, dizziness, syncope, edema, weight gain, or early satiety. All other systems reviewed and are otherwise negative except as noted above.    Studies Reviewed EKG Interpretation Date/Time:  Wednesday September 10 2024 10:59:16 EST Ventricular Rate:  81 PR Interval:  166 QRS Duration:  90 QT Interval:  378 QTC Calculation: 439 R Axis:   -39  Text Interpretation: Normal sinus rhythm Left axis deviation  When compared with ECG of 11-Sep-2021 12:48, PREVIOUS ECG IS PRESENT Confirmed by Janene Boer [phone]) on 09/11/2024 2:17:47 PM    Cardiac Studies & Procedures   ______________________________________________________________________________________________     ECHOCARDIOGRAM  ECHOCARDIOGRAM COMPLETE 09/10/2024  Narrative ECHOCARDIOGRAM REPORT    Patient Name:   Derrick Rowe    Date of Exam: 09/10/2024 Medical Rec #:  [id number]     Height:       72.0 in Accession #:    [id number]    Weight:       217.0 lb Date of Birth:  [birth year]     BSA:          2.206 m Patient Age:    58 years      BP:           130/81 mmHg Patient Gender: M             HR:           54 bpm. Exam Location:  [address]  Procedure: 2D Echo, 3D Echo, Cardiac Doppler, Color Doppler and Strain Analysis (Both Spectral and Color Flow Doppler were utilized during procedure).  Indications:    I95.9 Hypotension  History:        Patient has no prior history of Echocardiogram examinations. Risk Factors:Current Smoker and HLD.  Sonographer:    Waldo Guadalajara RCS Referring Phys: [phone] Donie Lemelin  IMPRESSIONS   1. Left ventricular ejection fraction, by estimation, is 55 to 60%. Left ventricular ejection fraction by 3D volume is 59 %. The left ventricle has normal function. The left ventricle has no regional wall motion abnormalities. Left ventricular diastolic parameters were normal. The average left ventricular global longitudinal strain is -19.3 %. The global longitudinal strain is normal. 2. Right ventricular systolic function is normal. The right ventricular size is normal. There is normal pulmonary artery systolic pressure. 3. The mitral valve is normal in structure. Mild mitral valve regurgitation. No evidence of mitral stenosis. 4. The aortic valve is normal in structure. Aortic valve regurgitation is not visualized. No aortic stenosis is present. 5. Aortic dilatation noted. There is borderline dilatation of the  ascending aorta, measuring 39 mm. 6. The inferior vena cava is normal in size with greater than 50% respiratory variability, suggesting right atrial pressure of 3 mmHg.  FINDINGS Left Ventricle: Left ventricular ejection fraction, by estimation, is 55 to 60%. Left ventricular ejection fraction by 3D volume is 59 %. The left ventricle has normal function. The left ventricle has no regional wall motion abnormalities. The average left ventricular global longitudinal strain is -19.3 %. Strain was performed and the global longitudinal strain is normal. The left ventricular internal cavity size was normal in size. There is no left ventricular hypertrophy. Left ventricular diastolic parameters were normal.  Right Ventricle: The right ventricular size is normal. No increase in right ventricular wall thickness. Right ventricular systolic function is normal. There is normal pulmonary artery systolic pressure. The tricuspid regurgitant velocity is 2.15 m/s, and with  an assumed right atrial pressure of 3 mmHg, the estimated right ventricular systolic pressure is 21.5 mmHg.  Left [facility]: Left atrial size was normal in size.  Right [facility]: Right atrial size was normal in size.  Pericardium: There is no evidence of pericardial effusion.  Mitral Valve: The mitral valve is normal in structure. Mild mitral valve regurgitation. No evidence of mitral valve stenosis.  Tricuspid Valve: The tricuspid valve is normal in structure. Tricuspid valve regurgitation is mild . No evidence of tricuspid stenosis.  Aortic Valve: The aortic valve is normal in structure. Aortic valve regurgitation is not visualized. No aortic stenosis is present.  Pulmonic Valve: The pulmonic valve was normal in structure. Pulmonic valve regurgitation is mild. No evidence of pulmonic stenosis.  Aorta: The aortic root is normal in size and structure and aortic dilatation noted. There is borderline dilatation of the ascending aorta, measuring 39  mm.  Venous: The inferior vena cava is normal in size with greater than 50% respiratory variability, suggesting right atrial pressure of 3 mmHg.  IAS/Shunts: No atrial level shunt detected by color flow Doppler.  Additional Comments: 3D was performed not requiring image post processing on an independent workstation and was normal.   LEFT VENTRICLE PLAX 2D LVIDd:         4.80 cm         Diastology LVIDs:         3.40 cm         LV e' medial:    9.57 cm/s LV PW:         1.20 cm         LV E/e' medial:  7.8 LV IVS:        1.10 cm         LV e' lateral:   11.00 cm/s LVOT diam:     2.00 cm         LV E/e' lateral: 6.8 LV SV:         76 LV SV Index:   34              2D Longitudinal LVOT Area:     3.14 cm        Strain LV IVRT:       95 msec         2D Strain GLS   -20.0 % (A4C): 2D Strain GLS   -19.6 % (A3C): 2D Strain GLS   -18.3 % (A2C): 2D Strain GLS   -19.3 % Avg:  3D Volume EF LV 3D EF:    Left ventricul ar ejection fraction by 3D volume is 59 %.  3D Volume EF: 3D EF:        59 % LV EDV:       157 ml LV ESV:       65 ml LV SV:        92 ml  RIGHT VENTRICLE RV Basal diam:  3.80 cm     PULMONARY VEINS RV S prime:     11.30 cm/s  A Reversal Velocity: 23.70 cm/s TAPSE (M-mode): 2.3 cm      Diastolic Velocity:  50.70 cm/s RVSP:           21.5 mmHg   S/D Velocity:        1.20 Systolic Velocity:   60.20 cm/s  LEFT ATRIUM             Index        RIGHT ATRIUM  Index LA diam:        4.00 cm 1.81 cm/m   RA Pressure: 3.00 mmHg LA Vol (A2C):   75.7 ml 34.32 ml/m  RA Area:     17.50 cm LA Vol (A4C):   61.5 ml 27.88 ml/m  RA Volume:   51.90 ml  23.53 ml/m LA Biplane Vol: 68.6 ml 31.10 ml/m AORTIC VALVE LVOT Vmax:   102.00 cm/s LVOT Vmean:  63.900 cm/s LVOT VTI:    0.242 m  AORTA Ao Root diam: 3.40 cm Ao Asc diam:  3.90 cm  MITRAL VALVE               TRICUSPID VALVE MV Area (PHT):             TR Peak grad:   18.5 mmHg MV Decel Time:             TR  Vmax:        215.00 cm/s MV E velocity: 74.90 cm/s  Estimated RAP:  3.00 mmHg MV A velocity: 47.20 cm/s  RVSP:           21.5 mmHg MV E/A ratio:  1.59 SHUNTS Systemic VTI:  0.24 m Systemic Diam: 2.00 cm  Joelle Azobou Tonleu Electronically signed by Joelle Cedars Tonleu Signature Date/Time: 09/10/2024/4:53:32 PM    Final          ______________________________________________________________________________________________      Risk Assessment/Calculations           Physical Exam VS:  BP (!) 92/55   Pulse 81   Ht 6' (1.829 m)   Wt 217 lb (98.4 kg)   SpO2 97%   BMI 29.43 kg/m   No data found.     Wt Readings from Last 3 Encounters:  09/10/24 217 lb (98.4 kg)  05/30/18 250 lb (113.4 kg)  04/10/14 248 lb (112.5 kg)    GEN: Well nourished, well developed in no acute distress NECK: No JVD; No carotid bruits CARDIAC: RRR, no murmurs, rubs, gallops RESPIRATORY:  Clear to auscultation without rales, wheezing or rhonchi  ABDOMEN: Soft, non-tender, non-distended EXTREMITIES:  No edema; No deformity   ASSESSMENT AND PLAN  Hypotension: Denies any chest pain or shortness of breath.  No prior history of PE.  Heart rate is not elevated and patient's O2 saturation 97%, suspicion for PE fairly low.  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  At some point, his endocrinologist was concerned that he may have pituitary apoplexy.  He has been having some headache going back for the past year.  I think he will need a brain MRI, however prefers this to be done by his endocrinologist.  His PCP recently started him on 2.5 mg twice a day of midodrine, however this has not touched his blood pressure.  Interestingly, despite significantly low blood pressure, patient is largely asymptomatic.  He rarely has some dizziness and primarily happens after prolonged drive and when he get out of his truck.  Otherwise, he does not have any  dizziness on normal days when he changed body position.  Systolic blood pressure did drop from 90s down to 60s.  He plan to see his endocrinologist in the beginning of December, will follow-up on the result of the visit.  If the hypotension is truly the result of hypopituitarism, I think ultimate solution is to treat the underlying cause.  Although if blood pressure is persistently low, may increase midodrine up to 5  mg 3 times a day or even up to 10 mg 3 times a day in the future.  If severe hypopituitarism is a issue, then instead of using Florinef, treating his pituitary adenoma likely will offer more benefit.  Will obtain echocardiogram to make sure he does not have any structural heart issue.  If endocrinology service felt that this is primarily related to hypopituitarism, I would not recommend any further cardiac evaluation.       Dispo: Follow-up on endocrine workup.    Signed, Scot Ford, PA

## 2024-09-11 ENCOUNTER — Ambulatory Visit: Payer: Self-pay | Admitting: Physician Assistant

## 2024-09-11 NOTE — Telephone Encounter (Signed)
-----   Message from Derrick Rowe sent at 09/11/2024  2:09 PM EST ----- Normal pumping function of heart, no regional wall motion abnormality, mild mitral valve leakage, borderline dilated ascending aorta. Will follow up on his endocrinology visit to see if  endocrinologist think it may be hypopituitarism that's causing his severe hypotension. If endocrinologist does not think this is caused by hypopituitarism, then I will consider increase his midodrine  to 5mg  TID dosing and consider addition of florinef. However if the cause of hypotension is due to hypopituitarism, then treating the underlying cause should be the primary focus. Still waiting on  all the hormone blood work to result ----- Message ----- From: Interface, [employer] Sent: 09/10/2024   4:53 PM EST To: Hao Meng, PA

## 2024-09-11 NOTE — Telephone Encounter (Signed)
 Lvm to call back for results

## 2024-09-17 LAB — ACTH: ACTH: 9.1 pg/mL (ref 7.2–63.3)

## 2024-09-17 LAB — CORTISOL: Cortisol: 6.7 ug/dL (ref 6.2–19.4)

## 2024-09-17 LAB — TSH: TSH: 1.34 u[IU]/mL (ref 0.450–4.500)

## 2024-09-17 LAB — ADH: ADH: 2.1 pg/mL (ref 0.0–4.7)
